# Patient Record
Sex: Female | Born: 1985 | Race: White | Hispanic: No | Marital: Married | Smoking: Never smoker
Health system: Southern US, Community
[De-identification: ages and names within clinical notes are randomized; demographics above are authoritative.]

## PROBLEM LIST (undated history)

## (undated) ENCOUNTER — Inpatient Hospital Stay (HOSPITAL_COMMUNITY): Payer: Self-pay

## (undated) DIAGNOSIS — F32A Depression, unspecified: Secondary | ICD-10-CM

## (undated) DIAGNOSIS — Z789 Other specified health status: Secondary | ICD-10-CM

## (undated) DIAGNOSIS — E119 Type 2 diabetes mellitus without complications: Secondary | ICD-10-CM

## (undated) DIAGNOSIS — Z8619 Personal history of other infectious and parasitic diseases: Secondary | ICD-10-CM

## (undated) DIAGNOSIS — T8859XA Other complications of anesthesia, initial encounter: Secondary | ICD-10-CM

## (undated) DIAGNOSIS — K589 Irritable bowel syndrome without diarrhea: Secondary | ICD-10-CM

## (undated) DIAGNOSIS — F329 Major depressive disorder, single episode, unspecified: Secondary | ICD-10-CM

## (undated) DIAGNOSIS — D279 Benign neoplasm of unspecified ovary: Secondary | ICD-10-CM

## (undated) DIAGNOSIS — R197 Diarrhea, unspecified: Secondary | ICD-10-CM

## (undated) HISTORY — PX: TONSILLECTOMY: SUR1361

## (undated) HISTORY — DX: Diarrhea, unspecified: R19.7

## (undated) HISTORY — DX: Irritable bowel syndrome, unspecified: K58.9

## (undated) HISTORY — DX: Personal history of other infectious and parasitic diseases: Z86.19

## (undated) HISTORY — PX: CHOLECYSTECTOMY: SHX55

---
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
## 2003-05-15 ENCOUNTER — Other Ambulatory Visit: Admission: RE | Admit: 2003-05-15 | Discharge: 2003-05-15 | Payer: Self-pay | Admitting: Obstetrics and Gynecology

## 2004-05-16 ENCOUNTER — Other Ambulatory Visit: Admission: RE | Admit: 2004-05-16 | Discharge: 2004-05-16 | Payer: Self-pay | Admitting: Obstetrics and Gynecology

## 2005-05-17 ENCOUNTER — Other Ambulatory Visit: Admission: RE | Admit: 2005-05-17 | Discharge: 2005-05-17 | Payer: Self-pay | Admitting: Obstetrics and Gynecology

## 2010-07-15 ENCOUNTER — Other Ambulatory Visit (HOSPITAL_COMMUNITY): Payer: Self-pay | Admitting: Obstetrics and Gynecology

## 2010-07-15 DIAGNOSIS — Z3682 Encounter for antenatal screening for nuchal translucency: Secondary | ICD-10-CM

## 2010-07-15 LAB — HIV ANTIBODY (ROUTINE TESTING W REFLEX): HIV: NONREACTIVE

## 2010-07-15 LAB — GC/CHLAMYDIA PROBE AMP, GENITAL: Gonorrhea: NEGATIVE

## 2010-07-15 LAB — RPR: RPR: NONREACTIVE

## 2010-07-15 LAB — HEPATITIS B SURFACE ANTIGEN: Hepatitis B Surface Ag: NEGATIVE

## 2010-07-15 LAB — ANTIBODY SCREEN: Antibody Screen: NEGATIVE

## 2010-08-24 ENCOUNTER — Other Ambulatory Visit (HOSPITAL_COMMUNITY): Payer: Self-pay | Admitting: Obstetrics and Gynecology

## 2010-08-24 ENCOUNTER — Encounter (HOSPITAL_COMMUNITY): Payer: Self-pay

## 2010-08-24 ENCOUNTER — Ambulatory Visit (HOSPITAL_COMMUNITY)
Admission: RE | Admit: 2010-08-24 | Discharge: 2010-08-24 | Disposition: A | Discharge status: Home / Self Care | Payer: 59 | Source: Ambulatory Visit | Attending: Obstetrics and Gynecology | Admitting: Obstetrics and Gynecology

## 2010-08-24 DIAGNOSIS — E669 Obesity, unspecified: Secondary | ICD-10-CM | POA: Insufficient documentation

## 2010-08-24 DIAGNOSIS — Z3682 Encounter for antenatal screening for nuchal translucency: Secondary | ICD-10-CM

## 2010-08-24 DIAGNOSIS — O99210 Obesity complicating pregnancy, unspecified trimester: Secondary | ICD-10-CM | POA: Insufficient documentation

## 2010-08-24 DIAGNOSIS — O3510X0 Maternal care for (suspected) chromosomal abnormality in fetus, unspecified, not applicable or unspecified: Secondary | ICD-10-CM | POA: Insufficient documentation

## 2010-08-24 DIAGNOSIS — Z3689 Encounter for other specified antenatal screening: Secondary | ICD-10-CM | POA: Insufficient documentation

## 2010-08-24 DIAGNOSIS — O351XX0 Maternal care for (suspected) chromosomal abnormality in fetus, not applicable or unspecified: Secondary | ICD-10-CM | POA: Insufficient documentation

## 2010-08-25 ENCOUNTER — Other Ambulatory Visit: Payer: Self-pay | Admitting: Maternal and Fetal Medicine

## 2010-09-23 NOTE — Progress Notes (Signed)
See OB ultrasound report in [facility] 

## 2010-10-05 ENCOUNTER — Other Ambulatory Visit (HOSPITAL_COMMUNITY): Payer: Self-pay | Admitting: Obstetrics and Gynecology

## 2010-10-05 ENCOUNTER — Ambulatory Visit (HOSPITAL_COMMUNITY)
Admission: RE | Admit: 2010-10-05 | Discharge: 2010-10-05 | Disposition: A | Discharge status: Home / Self Care | Payer: 59 | Source: Ambulatory Visit | Attending: Obstetrics and Gynecology | Admitting: Obstetrics and Gynecology

## 2010-10-05 VITALS — BP 133/79 | HR 95 | Wt 258.0 lb

## 2010-10-05 DIAGNOSIS — O358XX0 Maternal care for other (suspected) fetal abnormality and damage, not applicable or unspecified: Secondary | ICD-10-CM | POA: Insufficient documentation

## 2010-10-05 DIAGNOSIS — Z1389 Encounter for screening for other disorder: Secondary | ICD-10-CM | POA: Insufficient documentation

## 2010-10-05 DIAGNOSIS — Z3682 Encounter for antenatal screening for nuchal translucency: Secondary | ICD-10-CM

## 2010-10-05 DIAGNOSIS — E669 Obesity, unspecified: Secondary | ICD-10-CM | POA: Insufficient documentation

## 2010-10-05 DIAGNOSIS — O99210 Obesity complicating pregnancy, unspecified trimester: Secondary | ICD-10-CM | POA: Insufficient documentation

## 2010-10-05 DIAGNOSIS — Z363 Encounter for antenatal screening for malformations: Secondary | ICD-10-CM | POA: Insufficient documentation

## 2010-10-05 NOTE — Progress Notes (Deleted)
Ultrasound in [facility].  Follow up U/S scheduled 

## 2010-10-05 NOTE — Progress Notes (Signed)
Report in [facility]; follow-up as needed 

## 2011-02-07 NOTE — L&D Delivery Note (Signed)
Delivery Note At 12:38 AM a viable and healthy female was delivered via Vaginal, Spontaneous Delivery (Presentation:Left occiput ;anterior  ).  APGAR: 8, 9; weight 8 lb 7.3 oz (3835 g).   Placenta status: Intact, Spontaneous.  Cord: 3 vessels   Pt progressed quickly in labor and delivered a vigorous female infant in the vertex LOA presentation weighing 8#7.3 with apgars of 8 & 9.  Placenta delivered spontaneously, intact, with 3V cord.  A second degree laceration was repaired with 3-0 vicryl.  Mother and baby are doing well after delivery. EBL 300  Anesthesia: Epidural  Episiotomy: None Lacerations: 2nd degree Suture Repair: 3-0 vicryl Est. Blood Loss (mL): 300  Mom to postpartum.  Baby to nursery-stable.  Dianna Ewald H. 02/26/2011, 1:03 AM

## 2011-02-09 ENCOUNTER — Encounter (HOSPITAL_COMMUNITY): Payer: Self-pay

## 2011-02-09 ENCOUNTER — Inpatient Hospital Stay (HOSPITAL_COMMUNITY)
Admission: AD | Admit: 2011-02-09 | Discharge: 2011-02-09 | Disposition: A | Discharge status: Home / Self Care | Payer: 59 | Source: Ambulatory Visit | Attending: Obstetrics and Gynecology | Admitting: Obstetrics and Gynecology

## 2011-02-09 DIAGNOSIS — R030 Elevated blood-pressure reading, without diagnosis of hypertension: Secondary | ICD-10-CM | POA: Insufficient documentation

## 2011-02-09 DIAGNOSIS — O99891 Other specified diseases and conditions complicating pregnancy: Secondary | ICD-10-CM | POA: Insufficient documentation

## 2011-02-09 HISTORY — DX: Major depressive disorder, single episode, unspecified: F32.9

## 2011-02-09 HISTORY — DX: Depression, unspecified: F32.A

## 2011-02-09 LAB — CBC
Platelets: 225 10*3/uL (ref 150–400)
RBC: 4.05 MIL/uL (ref 3.87–5.11)
WBC: 12.5 10*3/uL — ABNORMAL HIGH (ref 4.0–10.5)

## 2011-02-09 LAB — COMPREHENSIVE METABOLIC PANEL
ALT: 16 U/L (ref 0–35)
AST: 25 U/L (ref 0–37)
Albumin: 2.4 g/dL — ABNORMAL LOW (ref 3.5–5.2)
CO2: 22 mEq/L (ref 19–32)
Calcium: 8.8 mg/dL (ref 8.4–10.5)
Chloride: 103 mEq/L (ref 96–112)
GFR calc non Af Amer: >90 mL/min (ref >90)
Sodium: 133 mEq/L — ABNORMAL LOW (ref 135–145)
Total Bilirubin: 0.4 mg/dL (ref 0.3–1.2)

## 2011-02-09 LAB — URINALYSIS, ROUTINE W REFLEX MICROSCOPIC
Bilirubin Urine: NEGATIVE
Glucose, UA: NEGATIVE mg/dL
Hgb urine dipstick: NEGATIVE
Specific Gravity, Urine: 1.015 (ref 1.005–1.030)
Urobilinogen, UA: 0.2 mg/dL (ref 0.0–1.0)

## 2011-02-09 LAB — URINE MICROSCOPIC-ADD ON

## 2011-02-09 MED ORDER — ZOLPIDEM TARTRATE 10 MG PO TABS
10.0000 mg | ORAL_TABLET | Freq: Every evening | ORAL | Status: DC | PRN
Start: 2011-02-09 — End: 2011-02-27

## 2011-02-09 NOTE — Progress Notes (Signed)
Patient states she was seen in the office today for a regular visit. Blood pressure was elevated and sent to MAU for evaluation. Denies any bleeding, leaking or pain. Reports good fetal movement.

## 2011-02-09 NOTE — Progress Notes (Signed)
CC: Elevated BP, feeling poorly S: 26 yo G1P0 at 36+3 sent over from the office for elevated BPs and trace proteinuria.  Pt slept poorly last night and today she didn't feel well.  In the office today her BPs were 150/80. Patient denies HA, vision changes, N/V or severe epigastric pain or RUQ pain. O:  Filed Vitals:   02/09/11 1115 02/09/11 1130 02/09/11 1145 02/09/11 1200  BP: 131/83 129/85 109/80 110/82  Pulse: 94 92 97 103  Temp:      TempSrc:      Resp:      Height:      Weight:      SpO2:       AOx3, NAD Gravid soft, NT/ND FHT 140-150 reactive with accels, no decels Cvx 1-2/50/-2 posterior toco irritable, Q3-5  CMP     Component Value Date/Time   NA 133* 02/09/2011 1054   K 4.0 02/09/2011 1054   CL 103 02/09/2011 1054   CO2 22 02/09/2011 1054   GLUCOSE 86 02/09/2011 1054   BUN 7 02/09/2011 1054   CREATININE 0.81 02/09/2011 1054   CALCIUM 8.8 02/09/2011 1054   PROT 6.1 02/09/2011 1054   ALBUMIN 2.4* 02/09/2011 1054   AST 25 02/09/2011 1054   ALT 16 02/09/2011 1054   ALKPHOS 146* 02/09/2011 1054   BILITOT 0.4 02/09/2011 1054   GFRNONAA >90 02/09/2011 1054   GFRAA >90 02/09/2011 1054    CBC    Component Value Date/Time   WBC 12.5* 02/09/2011 1054   RBC 4.05 02/09/2011 1054   HGB 11.9* 02/09/2011 1054   HCT 35.6* 02/09/2011 1054   PLT 225 02/09/2011 1054   MCV 87.9 02/09/2011 1054   MCH 29.4 02/09/2011 1054   MCHC 33.4 02/09/2011 1054   RDW 13.8 02/09/2011 1054    UA neg protein  A/P 1) reassuring FWB 2) Normotensive BP, labs WNL 3) Ambien for difficulty sleeping. 4) D/C home

## 2011-02-23 ENCOUNTER — Encounter (HOSPITAL_COMMUNITY): Payer: Self-pay | Admitting: *Deleted

## 2011-02-23 ENCOUNTER — Telehealth (HOSPITAL_COMMUNITY): Payer: Self-pay | Admitting: *Deleted

## 2011-02-23 NOTE — Telephone Encounter (Signed)
Preadmission screen  

## 2011-02-25 ENCOUNTER — Encounter (HOSPITAL_COMMUNITY): Payer: Self-pay | Admitting: Anesthesiology

## 2011-02-25 ENCOUNTER — Inpatient Hospital Stay (HOSPITAL_COMMUNITY): Payer: 59 | Admitting: Anesthesiology

## 2011-02-25 ENCOUNTER — Inpatient Hospital Stay (HOSPITAL_COMMUNITY)
Admission: AD | Admit: 2011-02-25 | Discharge: 2011-02-27 | DRG: 775 | Disposition: A | Discharge status: Home / Self Care | Payer: 59 | Source: Ambulatory Visit | Attending: Obstetrics and Gynecology | Admitting: Obstetrics and Gynecology

## 2011-02-25 ENCOUNTER — Encounter (HOSPITAL_COMMUNITY): Payer: Self-pay

## 2011-02-25 DIAGNOSIS — R030 Elevated blood-pressure reading, without diagnosis of hypertension: Secondary | ICD-10-CM | POA: Diagnosis present

## 2011-02-25 DIAGNOSIS — O99892 Other specified diseases and conditions complicating childbirth: Secondary | ICD-10-CM | POA: Diagnosis present

## 2011-02-25 HISTORY — DX: Benign neoplasm of unspecified ovary: D27.9

## 2011-02-25 LAB — COMPREHENSIVE METABOLIC PANEL
AST: 25 U/L (ref 0–37)
Alkaline Phosphatase: 157 U/L — ABNORMAL HIGH (ref 39–117)
CO2: 21 mEq/L (ref 19–32)
Chloride: 104 mEq/L (ref 96–112)
Creatinine, Ser: 0.79 mg/dL (ref 0.50–1.10)
GFR calc non Af Amer: >90 mL/min (ref >90)
Total Bilirubin: 0.2 mg/dL — ABNORMAL LOW (ref 0.3–1.2)

## 2011-02-25 LAB — URIC ACID: Uric Acid, Serum: 5.9 mg/dL (ref 2.4–7.0)

## 2011-02-25 LAB — CBC
HCT: 33.2 % — ABNORMAL LOW (ref 36.0–46.0)
MCH: 29.3 pg (ref 26.0–34.0)
MCV: 87.6 fL (ref 78.0–100.0)
Platelets: 250 10*3/uL (ref 150–400)
RBC: 3.79 MIL/uL — ABNORMAL LOW (ref 3.87–5.11)

## 2011-02-25 MED ORDER — OXYTOCIN 20 UNITS IN LACTATED RINGERS INFUSION - SIMPLE
1.0000 m[IU]/min | INTRAVENOUS | Status: DC
  Administered 2011-02-25: 2 m[IU]/min via INTRAVENOUS
  Filled 2011-02-25: qty 1000

## 2011-02-25 MED ORDER — OXYCODONE-ACETAMINOPHEN 5-325 MG PO TABS
2.0000 | ORAL_TABLET | ORAL | Status: DC | PRN
  Administered 2011-02-26: 2 via ORAL
  Filled 2011-02-25: qty 2

## 2011-02-25 MED ORDER — LIDOCAINE-EPINEPHRINE 2 %-1:100000 IJ SOLN
INTRAMUSCULAR | Status: DC | PRN
  Administered 2011-02-25: 10 mL

## 2011-02-25 MED ORDER — OXYTOCIN BOLUS FROM INFUSION: 500.0000 mL | Freq: Once | INTRAVENOUS | Status: DC

## 2011-02-25 MED ORDER — ONDANSETRON HCL 4 MG/2ML IJ SOLN: 4.0000 mg | Freq: Four times a day (QID) | INTRAMUSCULAR | Status: DC | PRN

## 2011-02-25 MED ORDER — LIDOCAINE HCL 1.5 % IJ SOLN
INTRAMUSCULAR | Status: DC | PRN
  Administered 2011-02-25 (×2): 5 mL via EPIDURAL

## 2011-02-25 MED ORDER — OXYTOCIN 20 UNITS IN LACTATED RINGERS INFUSION - SIMPLE: 125.0000 mL/h | Freq: Once | INTRAVENOUS | Status: DC

## 2011-02-25 MED ORDER — CEFAZOLIN SODIUM 1-5 GM-% IV SOLN: 1.0000 g | Freq: Three times a day (TID) | INTRAVENOUS | Status: DC

## 2011-02-25 MED ORDER — BUTORPHANOL TARTRATE 2 MG/ML IJ SOLN
1.0000 mg | INTRAMUSCULAR | Status: DC | PRN
  Administered 2011-02-25 (×2): 1 mg via INTRAVENOUS
  Filled 2011-02-25 (×2): qty 1

## 2011-02-25 MED ORDER — PENICILLIN G POTASSIUM 5000000 UNITS IJ SOLR: 2.5000 10*6.[IU] | INTRAVENOUS | Status: DC

## 2011-02-25 MED ORDER — IBUPROFEN 600 MG PO TABS: 600.0000 mg | ORAL_TABLET | Freq: Four times a day (QID) | ORAL | Status: DC | PRN

## 2011-02-25 MED ORDER — LACTATED RINGERS IV SOLN
INTRAVENOUS | Status: DC
  Administered 2011-02-25: 22:00:00 via INTRAVENOUS
  Administered 2011-02-25: 125 mL/h via INTRAVENOUS

## 2011-02-25 MED ORDER — NALBUPHINE SYRINGE 5 MG/0.5 ML: 10.0000 mg | INJECTION | INTRAMUSCULAR | Status: DC | PRN

## 2011-02-25 MED ORDER — LACTATED RINGERS IV SOLN
500.0000 mL | Freq: Once | INTRAVENOUS | Status: AC
  Administered 2011-02-25: 500 mL via INTRAVENOUS

## 2011-02-25 MED ORDER — ACETAMINOPHEN 325 MG PO TABS: 650.0000 mg | ORAL_TABLET | ORAL | Status: DC | PRN

## 2011-02-25 MED ORDER — FENTANYL 2.5 MCG/ML BUPIVACAINE 1/10 % EPIDURAL INFUSION (WH - ANES)
14.0000 mL/h | INTRAMUSCULAR | Status: DC
  Administered 2011-02-25: 14 mL/h via EPIDURAL
  Filled 2011-02-25 (×2): qty 60

## 2011-02-25 MED ORDER — DIPHENHYDRAMINE HCL 50 MG/ML IJ SOLN: 12.5000 mg | INTRAMUSCULAR | Status: DC | PRN

## 2011-02-25 MED ORDER — EPHEDRINE 5 MG/ML INJ
10.0000 mg | INTRAVENOUS | Status: DC | PRN
  Filled 2011-02-25: qty 4

## 2011-02-25 MED ORDER — TERBUTALINE SULFATE 1 MG/ML IJ SOLN: 0.2500 mg | Freq: Once | INTRAMUSCULAR | Status: AC | PRN

## 2011-02-25 MED ORDER — PHENYLEPHRINE 40 MCG/ML (10ML) SYRINGE FOR IV PUSH (FOR BLOOD PRESSURE SUPPORT)
80.0000 ug | PREFILLED_SYRINGE | INTRAVENOUS | Status: DC | PRN
  Filled 2011-02-25: qty 5

## 2011-02-25 MED ORDER — OXYTOCIN BOLUS FROM INFUSION
500.0000 mL | Freq: Once | INTRAVENOUS | Status: DC
  Filled 2011-02-25: qty 500

## 2011-02-25 MED ORDER — LACTATED RINGERS IV SOLN: 500.0000 mL | INTRAVENOUS | Status: DC | PRN

## 2011-02-25 MED ORDER — CITRIC ACID-SODIUM CITRATE 334-500 MG/5ML PO SOLN: 30.0000 mL | ORAL | Status: DC | PRN

## 2011-02-25 MED ORDER — EPHEDRINE 5 MG/ML INJ
10.0000 mg | INTRAVENOUS | Status: DC | PRN
Start: 2011-02-25 — End: 2011-02-26

## 2011-02-25 MED ORDER — PENICILLIN G POTASSIUM 5000000 UNITS IJ SOLR: 5.0000 10*6.[IU] | Freq: Once | INTRAVENOUS | Status: DC

## 2011-02-25 MED ORDER — LIDOCAINE HCL (PF) 1 % IJ SOLN
30.0000 mL | INTRAMUSCULAR | Status: DC | PRN
  Administered 2011-02-26: 30 mL via SUBCUTANEOUS
  Filled 2011-02-25: qty 30

## 2011-02-25 MED ORDER — OXYCODONE-ACETAMINOPHEN 5-325 MG PO TABS: 2.0000 | ORAL_TABLET | ORAL | Status: DC | PRN

## 2011-02-25 MED ORDER — OXYTOCIN 20 UNITS IN LACTATED RINGERS INFUSION - SIMPLE
125.0000 mL/h | Freq: Once | INTRAVENOUS | Status: AC
  Administered 2011-02-26: 125 mL/h via INTRAVENOUS

## 2011-02-25 MED ORDER — PHENYLEPHRINE 40 MCG/ML (10ML) SYRINGE FOR IV PUSH (FOR BLOOD PRESSURE SUPPORT): 80.0000 ug | PREFILLED_SYRINGE | INTRAVENOUS | Status: DC | PRN

## 2011-02-25 MED ORDER — IBUPROFEN 600 MG PO TABS
600.0000 mg | ORAL_TABLET | Freq: Four times a day (QID) | ORAL | Status: DC | PRN
  Administered 2011-02-26: 600 mg via ORAL
  Filled 2011-02-25: qty 1

## 2011-02-25 MED ORDER — CEFAZOLIN SODIUM-DEXTROSE 2-3 GM-% IV SOLR: 2.0000 g | Freq: Once | INTRAVENOUS | Status: DC

## 2011-02-25 MED ORDER — FENTANYL 2.5 MCG/ML BUPIVACAINE 1/10 % EPIDURAL INFUSION (WH - ANES)
INTRAMUSCULAR | Status: DC | PRN
  Administered 2011-02-25: 14 mL/h via EPIDURAL

## 2011-02-25 MED ORDER — LACTATED RINGERS IV SOLN: INTRAVENOUS | Status: DC

## 2011-02-25 MED ORDER — BUTORPHANOL TARTRATE 2 MG/ML IJ SOLN: 1.0000 mg | INTRAMUSCULAR | Status: DC | PRN

## 2011-02-25 MED ORDER — ZOLPIDEM TARTRATE 10 MG PO TABS: 10.0000 mg | ORAL_TABLET | Freq: Every evening | ORAL | Status: DC | PRN

## 2011-02-25 NOTE — Progress Notes (Signed)
Pt reports having fluid leaking out about 1 hr ago. Blood tinged fluid note. Having mild-moderate contractions 7-10 min apart. Reports good fetal movement up until her water broke.

## 2011-02-25 NOTE — Anesthesia Preprocedure Evaluation (Signed)
Anesthesia Evaluation  Patient identified by MRN, date of birth, ID band Patient awake    Reviewed: Allergy & Precautions, H&P , NPO status , Patient's Chart, lab work & pertinent test results  Airway Mallampati: II TM Distance: >3 FB Neck ROM: full    Dental No notable dental hx.    Pulmonary neg pulmonary ROS,    Pulmonary exam normal       Cardiovascular neg cardio ROS     Neuro/Psych PSYCHIATRIC DISORDERS Depression Negative Neurological ROS     GI/Hepatic negative GI ROS, Neg liver ROS,   Endo/Other  Morbid obesity  Renal/GU negative Renal ROS  Genitourinary negative   Musculoskeletal negative musculoskeletal ROS (+)   Abdominal (+) obese,   Peds negative pediatric ROS (+)  Hematology negative hematology ROS (+)   Anesthesia Other Findings   Reproductive/Obstetrics (+) Pregnancy                           Anesthesia Physical Anesthesia Plan  ASA: III  Anesthesia Plan: Epidural   Post-op Pain Management:    Induction:   Airway Management Planned:   Additional Equipment:   Intra-op Plan:   Post-operative Plan:   Informed Consent: I have reviewed the patients History and Physical, chart, labs and discussed the procedure including the risks, benefits and alternatives for the proposed anesthesia with the patient or authorized representative who has indicated his/her understanding and acceptance.     Plan Discussed with:   Anesthesia Plan Comments:         Anesthesia Quick Evaluation  

## 2011-02-25 NOTE — Anesthesia Procedure Notes (Signed)
Epidural Patient location during procedure: OB Start time: 02/25/2011 10:05 PM End time: 02/25/2011 10:12 PM Reason for block: procedure for pain  Staffing Anesthesiologist: Sandrea Hughs Performed by: anesthesiologist   Preanesthetic Checklist Completed: patient identified, site marked, surgical consent, pre-op evaluation, timeout performed, IV checked, risks and benefits discussed and monitors and equipment checked  Epidural Patient position: sitting Prep: site prepped and draped and DuraPrep Patient monitoring: continuous pulse ox and blood pressure Approach: midline Injection technique: LOR air  Needle:  Needle type: Tuohy  Needle gauge: 17 G Needle length: 9 cm Needle insertion depth: 7 cm Catheter type: closed end flexible Catheter size: 19 Gauge Catheter at skin depth: 12 cm Test dose: negative and 1.5% lidocaine  Assessment Events: blood not aspirated, injection not painful, no injection resistance, negative IV test and no paresthesia

## 2011-02-25 NOTE — Progress Notes (Signed)
Patient ID: Sandy Mata, female   DOB: Jun 26, 1985, 26 y.o.   MRN: [id number]  S: Pt comfortable, having minimal bleeding currently  O: Filed Vitals:   02/25/11 1823 02/25/11 1835 02/25/11 1859  BP: 142/82  159/86  Pulse: 77  72  Temp: 98 F (36.7 C)    TempSrc: Oral    Resp: 20  18  Height:  5\' 8"  (1.727 m)   Weight:  127.914 kg (282 lb)    Cvx 3/80/-2  FHT: 140-150 reactive  toco irregular  AROM attempted x 3, only mucoid blood detected. No return of fluid.  Pt did report gush of bloody fluid at home prior to arrival. May have SROMd  A/P 1) Augement labor with pitocin 2) FWB reassuring

## 2011-02-25 NOTE — H&P (Signed)
Sandy Mata is a 26 y.o. female presenting for bleeding 1-2 hours prior to admission the patient noticed a large gush of fluid and when she checked she was bleeding.  She denies contractions or abdominal pain.  She has had active fetal movement throughout the day.  Since 35-36 weeks she has had some elevated BPs. Pre-eclampsia labs and a 24 hour urine were normal. Given these findings the decision was made to admit the patient for induction of labor. 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  History OB History    Grav Para Term Preterm Abortions TAB SAB Ect Mult Living   1 0 0 0 0 0 0 0 0 0      Past Medical History  Diagnosis Date  . Depression     quit meds with pregnancy  . Ulcer   . History of chlamydia   . IBS (irritable bowel syndrome)   . Dermoid cyst of ovary    Past Surgical History  Procedure Date  . Cholecystectomy   . Tonsillectomy    Family History: family history includes Crohn's disease in her brother; Diabetes in her father and paternal grandmother; Heart disease in her father; and Hypertension in her father. Social History:  reports that she has never smoked. She has never used smokeless tobacco. She reports that she does not drink alcohol or use illicit drugs.  ROS: as above  Dilation: 3 Effacement (%): 70 Station: -1 Exam by:: Sandy Mata CNM There were no vitals taken for this visit. Exam Physical Exam  Prenatal labs: ABO, Rh: O/Positive/-- (06/08 0000) Antibody: Negative (06/08 0000) Rubella: Immune (06/08 0000) RPR: Nonreactive (06/08 0000)  HBsAg: Negative (06/08 0000)  HIV: Non-reactive (06/08 0000)  GBS: Negative (12/27 0000)   Assessment/Plan: 1) Admit 2) AROM 3) Pitocin if necessary for augmentation of labor 4) Monitor bleeding, if worsens or NRFWB develops then proceed with cesarean section.  Will consent  Sandy Mata H. 02/25/2011, 6:13 PM

## 2011-02-25 NOTE — ED Provider Notes (Signed)
History     Chief Complaint  Patient presents with  . Rupture of Membranes   HPI 26 y.o. G1P0000 at [redacted]w[redacted]d c/o leaking bloody fluid since 1600 today, no pain, + fetal movement. Normal prenatal course.    Past Medical History  Diagnosis Date  . Depression     quit meds with pregnancy  . Ulcer   . History of chlamydia   . IBS (irritable bowel syndrome)     Past Surgical History  Procedure Date  . Cholecystectomy   . Tonsillectomy     Family History  Problem Relation Age of Onset  . Heart disease Father   . Hypertension Father   . Diabetes Father   . Crohn's disease Brother   . Diabetes Paternal Grandmother     History  Substance Use Topics  . Smoking status: Never Smoker   . Smokeless tobacco: Never Used  . Alcohol Use: No    Allergies: No Known Allergies  Prescriptions prior to admission  Medication Sig Dispense Refill  . Ondansetron HCl (ZOFRAN PO) Take 1 tablet by mouth daily as needed. Patient is using this medication for nausea.      . Prenatal Vitamins (DIS) TABS Take by mouth.        . zolpidem (AMBIEN) 10 MG tablet Take 1 tablet (10 mg total) by mouth at bedtime as needed for sleep.  30 tablet  0    Review of Systems  Constitutional: Negative.   Respiratory: Negative.   Cardiovascular: Negative.   Gastrointestinal: Negative for nausea, vomiting, abdominal pain, diarrhea and constipation.  Genitourinary: Negative for dysuria, urgency, frequency, hematuria and flank pain.       Positive for vaginal bleeding, negative for contractions   Musculoskeletal: Negative.   Neurological: Negative.   Psychiatric/Behavioral: Negative.    Physical Exam   There were no vitals taken for this visit.  Physical Exam  Nursing note and vitals reviewed. Constitutional: She is oriented to person, place, and time. She appears well-developed and well-nourished. No distress.  HENT:  Head: Normocephalic and atraumatic.  Cardiovascular: Normal rate.   Respiratory:  Effort normal.  GI: Soft. Bowel sounds are normal. She exhibits no mass. There is no tenderness. There is no rebound and no guarding.  Genitourinary: There is no rash or lesion on the right labia. There is no rash or lesion on the left labia. Uterus is not tender. Enlarged: Size c/w dates. Cervix exhibits no discharge. There is bleeding (moderate) around the vagina. No tenderness around the vagina. No vaginal discharge found.  Musculoskeletal: Normal range of motion.  Neurological: She is alert and oriented to person, place, and time.  Skin: Skin is warm and dry.  Psychiatric: She has a normal mood and affect.   Fern negative  EFM: 150, mod variability, + accels, no decels  MAU Course  Procedures    Assessment and Plan  26 y.o. G1P0000 at [redacted]w[redacted]d Bleeding Pt to be admitted to L&D per Dr. Sallee Lange 02/25/2011, 5:04 PM

## 2011-02-26 ENCOUNTER — Encounter (HOSPITAL_COMMUNITY): Payer: Self-pay | Admitting: *Deleted

## 2011-02-26 MED ORDER — DIPHENHYDRAMINE HCL 25 MG PO CAPS: 25.0000 mg | ORAL_CAPSULE | Freq: Four times a day (QID) | ORAL | Status: DC | PRN

## 2011-02-26 MED ORDER — SENNOSIDES-DOCUSATE SODIUM 8.6-50 MG PO TABS
2.0000 | ORAL_TABLET | Freq: Every day | ORAL | Status: DC
  Administered 2011-02-26: 2 via ORAL

## 2011-02-26 MED ORDER — LANOLIN HYDROUS EX OINT: TOPICAL_OINTMENT | CUTANEOUS | Status: DC | PRN

## 2011-02-26 MED ORDER — TETANUS-DIPHTH-ACELL PERTUSSIS 5-2.5-18.5 LF-MCG/0.5 IM SUSP: 0.5000 mL | Freq: Once | INTRAMUSCULAR | Status: DC

## 2011-02-26 MED ORDER — PRENATAL MULTIVITAMIN CH
1.0000 | ORAL_TABLET | Freq: Every day | ORAL | Status: DC
  Administered 2011-02-26 – 2011-02-27 (×2): 1 via ORAL
  Filled 2011-02-26 (×2): qty 1

## 2011-02-26 MED ORDER — METHYLERGONOVINE MALEATE 0.2 MG/ML IJ SOLN: 0.2000 mg | INTRAMUSCULAR | Status: DC | PRN

## 2011-02-26 MED ORDER — DIBUCAINE 1 % RE OINT: 1.0000 "application " | TOPICAL_OINTMENT | RECTAL | Status: DC | PRN

## 2011-02-26 MED ORDER — BENZOCAINE-MENTHOL 20-0.5 % EX AERO
1.0000 "application " | INHALATION_SPRAY | CUTANEOUS | Status: DC | PRN
  Administered 2011-02-27: 1 via TOPICAL

## 2011-02-26 MED ORDER — ONDANSETRON HCL 4 MG/2ML IJ SOLN: 4.0000 mg | INTRAMUSCULAR | Status: DC | PRN

## 2011-02-26 MED ORDER — ONDANSETRON HCL 4 MG PO TABS
4.0000 mg | ORAL_TABLET | ORAL | Status: DC | PRN
  Administered 2011-02-27: 4 mg via ORAL
  Filled 2011-02-26: qty 1

## 2011-02-26 MED ORDER — BENZOCAINE-MENTHOL 20-0.5 % EX AERO
INHALATION_SPRAY | CUTANEOUS | Status: AC
  Administered 2011-02-27: 1 via TOPICAL
  Filled 2011-02-26: qty 56

## 2011-02-26 MED ORDER — ZOLPIDEM TARTRATE 5 MG PO TABS
5.0000 mg | ORAL_TABLET | Freq: Every evening | ORAL | Status: DC | PRN
Start: 2011-02-26 — End: 2011-02-27

## 2011-02-26 MED ORDER — OXYCODONE-ACETAMINOPHEN 5-325 MG PO TABS
1.0000 | ORAL_TABLET | ORAL | Status: DC | PRN
  Administered 2011-02-26 – 2011-02-27 (×5): 1 via ORAL
  Filled 2011-02-26 (×5): qty 1

## 2011-02-26 MED ORDER — METHYLERGONOVINE MALEATE 0.2 MG PO TABS: 0.2000 mg | ORAL_TABLET | ORAL | Status: DC | PRN

## 2011-02-26 MED ORDER — SIMETHICONE 80 MG PO CHEW: 80.0000 mg | CHEWABLE_TABLET | ORAL | Status: DC | PRN

## 2011-02-26 MED ORDER — WITCH HAZEL-GLYCERIN EX PADS: 1.0000 "application " | MEDICATED_PAD | CUTANEOUS | Status: DC | PRN

## 2011-02-26 MED ORDER — IBUPROFEN 600 MG PO TABS
600.0000 mg | ORAL_TABLET | Freq: Four times a day (QID) | ORAL | Status: DC
  Administered 2011-02-26 – 2011-02-27 (×5): 600 mg via ORAL
  Filled 2011-02-26 (×6): qty 1

## 2011-02-26 NOTE — Anesthesia Postprocedure Evaluation (Signed)
Anesthesia Post Note  Patient: Sandy Mata  Procedure(s) Performed: * No procedures listed *  Anesthesia type: Epidural  Patient location: Mother/Baby  Post pain: Pain level controlled  Post assessment: Post-op Vital signs reviewed  Last Vitals:  Filed Vitals:   02/26/11 0321  BP: 142/82  Pulse: 96  Temp:   Resp: 18    Post vital signs: Reviewed  Level of consciousness: awake  Complications: No apparent anesthesia complications

## 2011-02-26 NOTE — Anesthesia Postprocedure Evaluation (Signed)
  Anesthesia Post-op Note  Patient: Sandy Mata  Procedure(s) Performed: * No procedures listed *  Patient Location: Mother/Baby  Anesthesia Type: Epidural  Level of Consciousness: awake, alert  and oriented  Airway and Oxygen Therapy: Patient Spontanous Breathing  Post-op Pain: mild  Post-op Assessment: Patient's Cardiovascular Status Stable, Respiratory Function Stable, Patent Airway, No signs of Nausea or vomiting and Pain level controlled  Post-op Vital Signs: stable  Complications: No apparent anesthesia complications

## 2011-02-26 NOTE — Progress Notes (Signed)

## 2011-02-26 NOTE — Addendum Note (Signed)
Addendum  created 02/26/11 1002 by Lincoln Brigham, CRNA   Modules edited:Charges VN

## 2011-02-26 NOTE — Addendum Note (Signed)
Addendum  created 02/26/11 1000 by Lincoln Brigham, CRNA   Modules edited:Charges VN, Notes Section

## 2011-02-26 NOTE — Progress Notes (Signed)
Post Partum Day 0 Subjective: no complaints, up ad lib, voiding and tolerating PO  Objective: Blood pressure 128/79, pulse 91, temperature 98.6 F (37 C), temperature source Oral, resp. rate 18, height 5\' 8"  (1.727 m), weight 127.914 kg (282 lb), SpO2 98.00%, unknown if currently breastfeeding.  Physical Exam:  General: alert, cooperative and appears stated age Lochia: appropriate Uterine Fundus: firm DVT Evaluation: No evidence of DVT seen on physical exam.   Basename 02/25/11 1805  HGB 11.1*  HCT 33.2*    Assessment/Plan: Routine Postpartum   LOS: 1 day   Sandy Mata H. 02/26/2011, 10:06 AM

## 2011-02-27 LAB — RPR: RPR Ser Ql: NONREACTIVE

## 2011-02-27 LAB — CBC
Hemoglobin: 9 g/dL — ABNORMAL LOW (ref 12.0–15.0)
MCH: 29 pg (ref 26.0–34.0)
MCHC: 32 g/dL (ref 30.0–36.0)
MCV: 90.6 fL (ref 78.0–100.0)
RBC: 3.1 MIL/uL — ABNORMAL LOW (ref 3.87–5.11)

## 2011-02-27 MED ORDER — BENZOCAINE-MENTHOL 20-0.5 % EX AERO
INHALATION_SPRAY | CUTANEOUS | Status: AC
  Filled 2011-02-27: qty 56

## 2011-02-27 MED ORDER — IBUPROFEN 600 MG PO TABS: 600.0000 mg | ORAL_TABLET | Freq: Four times a day (QID) | ORAL | Status: AC

## 2011-02-27 NOTE — Discharge Summary (Signed)
Obstetric Discharge Summary Reason for Admission: Elevated blood pressures, vaginal bleeding Prenatal Procedures: none Intrapartum Procedures: spontaneous vaginal delivery Postpartum Procedures: none Complications-Operative and Postpartum: vaginal laceration Hemoglobin  Date Value Range Status  02/27/2011 9.0* 12.0-15.0 (g/dL) Final     DELTA CHECK NOTED     REPEATED TO VERIFY     HCT  Date Value Range Status  02/27/2011 28.1* 36.0-46.0 (%) Final    Discharge Diagnoses: Term Pregnancy-delivered  Discharge Information: Date: 02/27/2011 Activity: unrestricted Diet: routine Medications: PNV, Ibuprofen, Colace and Iron Condition: stable Instructions: refer to practice specific booklet Discharge to: home Follow-up Information    Follow up with Almon Hercules., MD in 4 weeks.   Contact information:   [address] [place] [address] [phone]          Newborn Data: Live born female  Birth Weight: 8 lb 7.3 oz (3835 g) APGAR: 8, 9  Home with mother.  Philip Aspen 02/27/2011, 8:45 AM

## 2011-03-02 ENCOUNTER — Inpatient Hospital Stay (HOSPITAL_COMMUNITY): Admission: RE | Admit: 2011-03-02 | Payer: 59 | Source: Ambulatory Visit

## 2013-02-06 NOTE — L&D Delivery Note (Signed)
Pt presents to L&D with premature labor. She progressed along a normal labor curve. She pushed for 68  Min and had a SVD of one live viable white female infant over a 2nd degree midline tear in the ROA position. Placenta-S/I. EBL-400cc. Tear closed with 3-0 chromic. Baby to NBN.

## 2013-06-16 LAB — OB RESULTS CONSOLE RPR: RPR: NONREACTIVE

## 2013-06-16 LAB — OB RESULTS CONSOLE ABO/RH: RH Type: POSITIVE

## 2013-06-16 LAB — OB RESULTS CONSOLE RUBELLA ANTIBODY, IGM: Rubella: IMMUNE

## 2013-06-16 LAB — OB RESULTS CONSOLE HIV ANTIBODY (ROUTINE TESTING): HIV: NONREACTIVE

## 2013-06-16 LAB — OB RESULTS CONSOLE HEPATITIS B SURFACE ANTIGEN: HEP B S AG: NEGATIVE

## 2013-06-16 LAB — OB RESULTS CONSOLE ANTIBODY SCREEN: ANTIBODY SCREEN: NEGATIVE

## 2013-07-28 ENCOUNTER — Other Ambulatory Visit (HOSPITAL_COMMUNITY): Payer: Self-pay | Admitting: Obstetrics & Gynecology

## 2013-07-28 DIAGNOSIS — Z1389 Encounter for screening for other disorder: Secondary | ICD-10-CM

## 2013-08-13 ENCOUNTER — Ambulatory Visit (HOSPITAL_COMMUNITY)
Admission: RE | Admit: 2013-08-13 | Discharge: 2013-08-13 | Disposition: A | Discharge status: Home / Self Care | Payer: 59 | Source: Ambulatory Visit | Attending: Obstetrics & Gynecology | Admitting: Obstetrics & Gynecology

## 2013-08-13 ENCOUNTER — Other Ambulatory Visit (HOSPITAL_COMMUNITY): Payer: Self-pay | Admitting: Obstetrics & Gynecology

## 2013-08-13 ENCOUNTER — Encounter (HOSPITAL_COMMUNITY): Payer: Self-pay

## 2013-08-13 DIAGNOSIS — Z1389 Encounter for screening for other disorder: Secondary | ICD-10-CM

## 2013-08-13 DIAGNOSIS — Z363 Encounter for antenatal screening for malformations: Secondary | ICD-10-CM | POA: Insufficient documentation

## 2013-12-04 ENCOUNTER — Encounter (HOSPITAL_COMMUNITY): Payer: Self-pay

## 2013-12-04 ENCOUNTER — Inpatient Hospital Stay (HOSPITAL_COMMUNITY)
Admission: AD | Admit: 2013-12-04 | Discharge: 2013-12-04 | Disposition: A | Discharge status: Home / Self Care | Payer: 59 | Source: Ambulatory Visit | Attending: Obstetrics and Gynecology | Admitting: Obstetrics and Gynecology

## 2013-12-04 DIAGNOSIS — O479 False labor, unspecified: Secondary | ICD-10-CM

## 2013-12-04 DIAGNOSIS — O4703 False labor before 37 completed weeks of gestation, third trimester: Secondary | ICD-10-CM

## 2013-12-04 DIAGNOSIS — Z3A35 35 weeks gestation of pregnancy: Secondary | ICD-10-CM | POA: Insufficient documentation

## 2013-12-04 LAB — URINALYSIS, ROUTINE W REFLEX MICROSCOPIC
Bilirubin Urine: NEGATIVE
Glucose, UA: NEGATIVE mg/dL
Hgb urine dipstick: NEGATIVE
Ketones, ur: NEGATIVE mg/dL
Nitrite: NEGATIVE
PROTEIN: NEGATIVE mg/dL
SPECIFIC GRAVITY, URINE: 1.02 (ref 1.005–1.030)
UROBILINOGEN UA: 0.2 mg/dL (ref 0.0–1.0)
pH: 7 (ref 5.0–8.0)

## 2013-12-04 LAB — URINE MICROSCOPIC-ADD ON

## 2013-12-04 NOTE — MAU Provider Note (Signed)
History     CSN: [id number]  Arrival date and time: 12/04/13 4098   First Provider Initiated Contact with Patient 12/04/13 [phone]      Chief Complaint  Patient presents with  . Contractions   HPI Sandy Mata 28 y.o. [id number] @[redacted]w[redacted]d  presents at [facility] complaining of back pain and abdominal pain that started at 9pm last evening.  She noes it comes and goes similar to a contraction.  It is worse with lying down.  The pains are increasingly intense.  She was able to sleep about 3 hours and has been up entirely since 2 pm.  She did not take any medicines.  She denies nausea, vomiting, diarrhea, constipation, fever, weakness, headache, vaginal bleeding, dysuria.  She endorses good fetal movement.   OB History   Grav Para Term Preterm Abortions TAB SAB Ect Mult Living   2 1 1  0 0 0 0 0 0 1      Past Medical History  Diagnosis Date  . Depression     quit meds with pregnancy  . History of chlamydia   . IBS (irritable bowel syndrome)   . Dermoid cyst of ovary     Past Surgical History  Procedure Laterality Date  . Cholecystectomy    . Tonsillectomy      Family History  Problem Relation Age of Onset  . Heart disease Father   . Hypertension Father   . Diabetes Father   . Crohn's disease Brother   . Diabetes Paternal Grandmother     History  Substance Use Topics  . Smoking status: Never Smoker   . Smokeless tobacco: Never Used  . Alcohol Use: No    Allergies: No Known Allergies  Prescriptions prior to admission  Medication Sig Dispense Refill  . Prenatal Vit-Fe Fumarate-FA (PRENATAL MULTIVITAMIN) TABS tablet Take 1 tablet by mouth daily at 12 noon.        ROS Pertinent ROS in HPI Physical Exam   Blood pressure 127/83, pulse 90, resp. rate 18, height 5\' 8"  (1.727 m), weight 292 lb (132.45 kg), unknown if currently breastfeeding.  Physical Exam  Constitutional: She is oriented to person, place, and time. She appears well-developed and well-nourished.  HENT:  Head:  Normocephalic and atraumatic.  Eyes: EOM are normal.  Neck: Normal range of motion.  Cardiovascular: Normal rate and regular rhythm.   Respiratory: Effort normal and breath sounds normal. No respiratory distress.  GI: Soft. Bowel sounds are normal. She exhibits no distension.  Genitourinary: Vagina normal.  Musculoskeletal: Normal range of motion.  Neurological: She is alert and oriented to person, place, and time.  Skin: Skin is warm and dry.  Psychiatric: She has a normal mood and affect.   Results for orders placed during the hospital encounter of 12/04/13 (from the past 24 hour(s))  URINALYSIS, ROUTINE W REFLEX MICROSCOPIC     Status: Abnormal   Collection Time    12/04/13  7:55 AM      Result Value Ref Range   Color, Urine YELLOW  YELLOW   APPearance CLEAR  CLEAR   Specific Gravity, Urine 1.020  1.005 - 1.030   pH 7.0  5.0 - 8.0   Glucose, UA NEGATIVE  NEGATIVE mg/dL   Hgb urine dipstick NEGATIVE  NEGATIVE   Bilirubin Urine NEGATIVE  NEGATIVE   Ketones, ur NEGATIVE  NEGATIVE mg/dL   Protein, ur NEGATIVE  NEGATIVE mg/dL   Urobilinogen, UA 0.2  0.0 - 1.0 mg/dL   Nitrite NEGATIVE  NEGATIVE  Leukocytes, UA SMALL (*) NEGATIVE  URINE MICROSCOPIC-ADD ON     Status: None   Collection Time    12/04/13  7:55 AM      Result Value Ref Range   Squamous Epithelial / LPF RARE  RARE   WBC, UA 3-6  <3 WBC/hpf   Bacteria, UA RARE  RARE    Fetal Tracing:  Baseline:130s Variability:moderate (was less so in beginning but improved) Accelerations: 15x15 Decelerations:none  Toco:irregular   MAU Course  Procedures  MDM Discussed with Dr. Ouida Sills.  No regular, strong contractions, no LOF/vaginal bleeding and no change in cervix.  Okay to send home.  Assessment and Plan  A:  1. Braxton Hick's contraction    P: Discharge to home Labor Precautions given See OB MD asap for further discussion Return to MAU for emergency  Paticia Stack 12/04/2013, 8:25 AM

## 2013-12-04 NOTE — Discharge Instructions (Signed)
Braxton Hicks Contractions °Contractions of the uterus can occur throughout pregnancy. Contractions are not always a sign that you are in labor.  °WHAT ARE BRAXTON HICKS CONTRACTIONS?  °Contractions that occur before labor are called Braxton Hicks contractions, or false labor. Toward the end of pregnancy (32-34 weeks), these contractions can develop more often and may become more forceful. This is not true labor because these contractions do not result in opening (dilatation) and thinning of the cervix. They are sometimes difficult to tell apart from true labor because these contractions can be forceful and people have different pain tolerances. You should not feel embarrassed if you go to the hospital with false labor. Sometimes, the only way to tell if you are in true labor is for your health care provider to look for changes in the cervix. °If there are no prenatal problems or other health problems associated with the pregnancy, it is completely safe to be sent home with false labor and await the onset of true labor. °HOW CAN YOU TELL THE DIFFERENCE BETWEEN TRUE AND FALSE LABOR? °False Labor °· The contractions of false labor are usually shorter and not as hard as those of true labor.   °· The contractions are usually irregular.   °· The contractions are often felt in the front of the lower abdomen and in the groin.   °· The contractions may go away when you walk around or change positions while lying down.   °· The contractions get weaker and are shorter lasting as time goes on.   °· The contractions do not usually become progressively stronger, regular, and closer together as with true labor.   °True Labor °· Contractions in true labor last 30-70 seconds, become very regular, usually become more intense, and increase in frequency.   °· The contractions do not go away with walking.   °· The discomfort is usually felt in the top of the uterus and spreads to the lower abdomen and low back.   °· True labor can be  determined by your health care provider with an exam. This will show that the cervix is dilating and getting thinner.   °WHAT TO REMEMBER °· Keep up with your usual exercises and follow other instructions given by your health care provider.   °· Take medicines as directed by your health care provider.   °· Keep your regular prenatal appointments.   °· Eat and drink lightly if you think you are going into labor.   °· If Braxton Hicks contractions are making you uncomfortable:   °¨ Change your position from lying down or resting to walking, or from walking to resting.   °¨ Sit and rest in a tub of warm water.   °¨ Drink 2-3 glasses of water. Dehydration may cause these contractions.   °¨ Do slow and deep breathing several times an hour.   °WHEN SHOULD I SEEK IMMEDIATE MEDICAL CARE? °Seek immediate medical care if: °· Your contractions become stronger, more regular, and closer together.   °· You have fluid leaking or gushing from your vagina.   °· You have a fever.   °· You pass blood-tinged mucus.   °· You have vaginal bleeding.   °· You have continuous abdominal pain.   °· You have low back pain that you never had before.   °· You feel your baby's head pushing down and causing pelvic pressure.   °· Your baby is not moving as much as it used to.   °Document Released: 01/23/2005 Document Revised: 01/28/2013 Document Reviewed: 11/04/2012 °[employer]® Patient Information ©2015 [employer]. This information is not intended to replace advice given to you by your health care   provider. Make sure you discuss any questions you have with your health care provider. ° °

## 2013-12-04 NOTE — MAU Note (Signed)
Lower abdominal & back cramping since 9 pm last night. Denies LOF or vaginal bleeding. Positive fetal movement.

## 2013-12-08 ENCOUNTER — Encounter (HOSPITAL_COMMUNITY): Payer: Self-pay

## 2013-12-09 ENCOUNTER — Encounter (HOSPITAL_COMMUNITY): Payer: Self-pay | Admitting: *Deleted

## 2013-12-09 ENCOUNTER — Inpatient Hospital Stay (HOSPITAL_COMMUNITY)
Admission: AD | Admit: 2013-12-09 | Discharge: 2013-12-09 | Disposition: A | Discharge status: Home / Self Care | Payer: 59 | Source: Ambulatory Visit | Attending: Obstetrics and Gynecology | Admitting: Obstetrics and Gynecology

## 2013-12-09 DIAGNOSIS — Z3A35 35 weeks gestation of pregnancy: Secondary | ICD-10-CM | POA: Diagnosis not present

## 2013-12-09 DIAGNOSIS — O4703 False labor before 37 completed weeks of gestation, third trimester: Secondary | ICD-10-CM

## 2013-12-09 LAB — OB RESULTS CONSOLE GBS: GBS: POSITIVE

## 2013-12-09 MED ORDER — NIFEDIPINE 10 MG PO CAPS
10.0000 mg | ORAL_CAPSULE | Freq: Once | ORAL | Status: AC
  Administered 2013-12-09: 10 mg via ORAL
  Filled 2013-12-09: qty 1

## 2013-12-09 MED ORDER — ZOLPIDEM TARTRATE ER 12.5 MG PO TBCR: 12.5000 mg | EXTENDED_RELEASE_TABLET | Freq: Every evening | ORAL | Status: DC | PRN

## 2013-12-09 NOTE — Discharge Instructions (Signed)
Braxton Hicks Contractions °Contractions of the uterus can occur throughout pregnancy. Contractions are not always a sign that you are in labor.  °WHAT ARE BRAXTON HICKS CONTRACTIONS?  °Contractions that occur before labor are called Braxton Hicks contractions, or false labor. Toward the end of pregnancy (32-34 weeks), these contractions can develop more often and may become more forceful. This is not true labor because these contractions do not result in opening (dilatation) and thinning of the cervix. They are sometimes difficult to tell apart from true labor because these contractions can be forceful and people have different pain tolerances. You should not feel embarrassed if you go to the hospital with false labor. Sometimes, the only way to tell if you are in true labor is for your health care provider to look for changes in the cervix. °If there are no prenatal problems or other health problems associated with the pregnancy, it is completely safe to be sent home with false labor and await the onset of true labor. °HOW CAN YOU TELL THE DIFFERENCE BETWEEN TRUE AND FALSE LABOR? °False Labor °· The contractions of false labor are usually shorter and not as hard as those of true labor.   °· The contractions are usually irregular.   °· The contractions are often felt in the front of the lower abdomen and in the groin.   °· The contractions may go away when you walk around or change positions while lying down.   °· The contractions get weaker and are shorter lasting as time goes on.   °· The contractions do not usually become progressively stronger, regular, and closer together as with true labor.   °True Labor °· Contractions in true labor last 30-70 seconds, become very regular, usually become more intense, and increase in frequency.   °· The contractions do not go away with walking.   °· The discomfort is usually felt in the top of the uterus and spreads to the lower abdomen and low back.   °· True labor can be  determined by your health care provider with an exam. This will show that the cervix is dilating and getting thinner.   °WHAT TO REMEMBER °· Keep up with your usual exercises and follow other instructions given by your health care provider.   °· Take medicines as directed by your health care provider.   °· Keep your regular prenatal appointments.   °· Eat and drink lightly if you think you are going into labor.   °· If Braxton Hicks contractions are making you uncomfortable:   °¨ Change your position from lying down or resting to walking, or from walking to resting.   °¨ Sit and rest in a tub of warm water.   °¨ Drink 2-3 glasses of water. Dehydration may cause these contractions.   °¨ Do slow and deep breathing several times an hour.   °WHEN SHOULD I SEEK IMMEDIATE MEDICAL CARE? °Seek immediate medical care if: °· Your contractions become stronger, more regular, and closer together.   °· You have fluid leaking or gushing from your vagina.   °· You have a fever.   °· You pass blood-tinged mucus.   °· You have vaginal bleeding.   °· You have continuous abdominal pain.   °· You have low back pain that you never had before.   °· You feel your baby's head pushing down and causing pelvic pressure.   °· Your baby is not moving as much as it used to.   °Document Released: 01/23/2005 Document Revised: 01/28/2013 Document Reviewed: 11/04/2012 °[employer]® Patient Information ©2015 [employer]. This information is not intended to replace advice given to you by your health care   provider. Make sure you discuss any questions you have with your health care provider. ° °

## 2013-12-09 NOTE — MAU Provider Note (Signed)
Chief Complaint:  Contractions   First Provider Initiated Contact with Patient 12/09/13 1640      HPI: Sandy Mata is a 28 y.o. G2P1001 at [redacted]w[redacted]d who presents 4 painful contractions that began at 06 30. Had office scheduled prenatal visit this morning cervix was 1 cm on vaginal exam. She's continued to have pain and also had some bloody show after the exam. Denies contractions, leakage of fluid. Good fetal movement.   Pregnancy Course: Essentially uncomplicted  Past Medical History: Past Medical History  Diagnosis Date  . Depression     quit meds with pregnancy  . History of chlamydia   . IBS (irritable bowel syndrome)   . Dermoid cyst of ovary     Past obstetric history: OB History  Gravida Para Term Preterm AB SAB TAB Ectopic Multiple Living  2 1 1  0 0 0 0 0 0 1    # Outcome Date GA Lbr Len/2nd Weight Sex Delivery Anes PTL Lv  2 Current           1 Term 02/26/11 [redacted]w[redacted]d 05:23 / 00:23 3.835 kg (8 lb 7.3 oz) F Vag-Spont EPI  Y      Past Surgical History: Past Surgical History  Procedure Laterality Date  . Cholecystectomy    . Tonsillectomy       Family History: Family History  Problem Relation Age of Onset  . Heart disease Father   . Hypertension Father   . Diabetes Father   . Crohn's disease Brother   . Diabetes Paternal Grandmother     Social History: History  Substance Use Topics  . Smoking status: Never Smoker   . Smokeless tobacco: Never Used  . Alcohol Use: No    Allergies: No Known Allergies  Meds:  Prescriptions prior to admission  Medication Sig Dispense Refill Last Dose  . acetaminophen (TYLENOL) 325 MG tablet Take 650 mg by mouth every 6 (six) hours as needed for moderate pain.   12/09/2013 at Unknown time  . Prenatal Vit-Fe Fumarate-FA (PRENATAL MULTIVITAMIN) TABS tablet Take 1 tablet by mouth daily at 12 noon.   12/08/2013 at Unknown time    ROS: Pertinent findings in history of present illness.  Physical Exam  Blood pressure 134/78, pulse  97, temperature 97.1 F (36.2 C), temperature source Oral, resp. rate 18, unknown if currently breastfeeding. GENERAL: Well-developed, well-nourished female in no acute distress.  HEENT: normocephalic HEART: normal rate RESP: normal effort ABDOMEN: Soft, non-tender, gravid appropriate for gestational age EXTREMITIES: Nontender, no edema NEURO: alert and oriented SPECULUM EXAM: NEFG, physiologic discharge, no blood, cervix clean Dilation: 2 Effacement (%): 30 Cervical Position: Posterior Station: -2 Presentation: Vertex Exam by:: AThurman Coyer, RN  FHT:  Baseline 140, moderate variability, accelerations present, no decelerations Contractions: irregular mild diminished to UI   Labs: No results found for this or any previous visit (from the past 24 hour(s)).  Imaging:  No results found. MAU Course: RN had consulted with Dr. Philis Pique and given Procardia 30mg  x1.  Pain improved after Procardia and rest. SVE at 1640: 2/L/H, slight show  Assessment: 1. False labor before 37 completed weeks of gestation in third trimester   [place] 1 FHR  Plan: Discharge home Labor precautions and fetal kick counts   Medication List    TAKE these medications        acetaminophen 325 MG tablet  Commonly known as:  TYLENOL  Take 650 mg by mouth every 6 (six) hours as needed for moderate pain.  prenatal multivitamin Tabs tablet  Take 1 tablet by mouth daily at 12 noon.     zolpidem 12.5 MG CR tablet  Commonly known as:  AMBIEN CR  Take 1 tablet (12.5 mg total) by mouth at bedtime as needed for sleep.       Follow-up Information    Follow up with Chayanne Speir A, MD.   Specialty:  Obstetrics and Gynecology   Why:  Keep your scheduled prenatal appointment   Contact information:   [place] [address] [phone]        Lorene Dy, CNM 12/09/2013 4:43 PM

## 2013-12-09 NOTE — MAU Note (Signed)
Contractions started at 0800 this morning, got worse around 1330, pt was seen at the office this morning and has progressively gotten worse.  Denies LOF/VB

## 2013-12-13 ENCOUNTER — Inpatient Hospital Stay (HOSPITAL_COMMUNITY)
Admission: AD | Admit: 2013-12-13 | Discharge: 2013-12-13 | Disposition: A | Discharge status: Home / Self Care | Payer: 59 | Source: Ambulatory Visit | Attending: Obstetrics & Gynecology | Admitting: Obstetrics & Gynecology

## 2013-12-13 ENCOUNTER — Encounter (HOSPITAL_COMMUNITY): Payer: Self-pay

## 2013-12-13 DIAGNOSIS — R109 Unspecified abdominal pain: Secondary | ICD-10-CM | POA: Diagnosis not present

## 2013-12-13 DIAGNOSIS — Z3A36 36 weeks gestation of pregnancy: Secondary | ICD-10-CM | POA: Diagnosis not present

## 2013-12-13 DIAGNOSIS — O26899 Other specified pregnancy related conditions, unspecified trimester: Secondary | ICD-10-CM

## 2013-12-13 DIAGNOSIS — O9989 Other specified diseases and conditions complicating pregnancy, childbirth and the puerperium: Secondary | ICD-10-CM

## 2013-12-13 LAB — URINALYSIS, ROUTINE W REFLEX MICROSCOPIC
BILIRUBIN URINE: NEGATIVE
GLUCOSE, UA: NEGATIVE mg/dL
HGB URINE DIPSTICK: NEGATIVE
KETONES UR: 15 mg/dL — AB
Leukocytes, UA: NEGATIVE
Nitrite: NEGATIVE
PROTEIN: NEGATIVE mg/dL
Specific Gravity, Urine: 1.025 (ref 1.005–1.030)
UROBILINOGEN UA: 0.2 mg/dL (ref 0.0–1.0)
pH: 8 (ref 5.0–8.0)

## 2013-12-13 LAB — CBC
HEMATOCRIT: 33.1 % — AB (ref 36.0–46.0)
Hemoglobin: 10.9 g/dL — ABNORMAL LOW (ref 12.0–15.0)
MCH: 28.6 pg (ref 26.0–34.0)
MCHC: 32.9 g/dL (ref 30.0–36.0)
MCV: 86.9 fL (ref 78.0–100.0)
Platelets: 217 10*3/uL (ref 150–400)
RBC: 3.81 MIL/uL — ABNORMAL LOW (ref 3.87–5.11)
RDW: 15.2 % (ref 11.5–15.5)
WBC: 14.3 10*3/uL — ABNORMAL HIGH (ref 4.0–10.5)

## 2013-12-13 MED ORDER — ACETAMINOPHEN 325 MG PO TABS
650.0000 mg | ORAL_TABLET | ORAL | Status: AC
  Administered 2013-12-13: 650 mg via ORAL
  Filled 2013-12-13: qty 2

## 2013-12-13 MED ORDER — CYCLOBENZAPRINE HCL 10 MG PO TABS: 10.0000 mg | ORAL_TABLET | Freq: Three times a day (TID) | ORAL | Status: DC | PRN

## 2013-12-13 MED ORDER — CYCLOBENZAPRINE HCL 10 MG PO TABS
10.0000 mg | ORAL_TABLET | ORAL | Status: AC
  Administered 2013-12-13: 10 mg via ORAL
  Filled 2013-12-13: qty 1

## 2013-12-13 NOTE — MAU Note (Signed)
Patient states the contractions started this morning around 0300.

## 2013-12-13 NOTE — MAU Provider Note (Signed)
Chief Complaint:  Contractions   First Provider Initiated Contact with Patient 12/13/13 1243      HPI: Sandy Mata is a 28 y.o. G2P1001 at 106w2d who presents to maternity admissions reporting right lower abdominal constant sharp pain and intermittent contractions making this pain worse.  She reports contractions every 2-5 minutes but these are mild compared to the pain her lower right. She reports the pain is sharp and radiates into her right lower back.  She denies known injury, or recent changes in activity.  She reports good fetal movement, denies LOF, vaginal bleeding, vaginal itching/burning, urinary symptoms, h/a, dizziness, n/v, or fever/chills.     Past Medical History: Past Medical History  Diagnosis Date  . Depression     quit meds with pregnancy  . History of chlamydia   . IBS (irritable bowel syndrome)   . Dermoid cyst of ovary     Past obstetric history: OB History  Gravida Para Term Preterm AB SAB TAB Ectopic Multiple Living  2 1 1  0 0 0 0 0 0 1    # Outcome Date GA Lbr Len/2nd Weight Sex Delivery Anes PTL Lv  2 Current           1 Term 02/26/11 [redacted]w[redacted]d 05:23 / 00:23 3.835 kg (8 lb 7.3 oz) F Vag-Spont EPI  Y      Past Surgical History: Past Surgical History  Procedure Laterality Date  . Cholecystectomy    . Tonsillectomy      Family History: Family History  Problem Relation Age of Onset  . Heart disease Father   . Hypertension Father   . Diabetes Father   . Crohn's disease Brother   . Diabetes Paternal Grandmother     Social History: History  Substance Use Topics  . Smoking status: Never Smoker   . Smokeless tobacco: Never Used  . Alcohol Use: No    Allergies: No Known Allergies  Meds:  No prescriptions prior to admission    ROS: Pertinent findings in history of present illness.  Physical Exam  Blood pressure 155/83, pulse 98, temperature 97.8 F (36.6 C), temperature source Oral, resp. rate 16, height 5\' 8"  (1.727 m), weight 135.342 kg  (298 lb 6 oz), not currently breastfeeding. GENERAL: Well-developed, well-nourished female in no acute distress.  HEENT: normocephalic HEART: normal rate RESP: normal effort ABDOMEN: Soft, mild tenderness in right lower abdomen/inguinal area, gravid appropriate for gestational age, no rebound tenderness, no guarding EXTREMITIES: Nontender, no edema NEURO: alert and oriented  Dilation: 1.5 Effacement (%): Thick Cervical Position: Middle Station: [place], -3 Presentation: Vertex Exam by:: Feliberto Harts RN  No cervical change in >1 hour in MAU  FHT:  Baseline 135 , moderate variability, accelerations present, no decelerations Contractions: irregular, mild to palpation   Labs:  Results for orders placed or performed during the hospital encounter of 12/13/13 (from the past 168 hour(s))  Urinalysis, Routine w reflex microscopic   Collection Time: 12/13/13 11:42 AM  Result Value Ref Range   Color, Urine YELLOW YELLOW   APPearance CLEAR CLEAR   Specific Gravity, Urine 1.025 1.005 - 1.030   pH 8.0 5.0 - 8.0   Glucose, UA NEGATIVE NEGATIVE mg/dL   Hgb urine dipstick NEGATIVE NEGATIVE   Bilirubin Urine NEGATIVE NEGATIVE   Ketones, ur 15 (A) NEGATIVE mg/dL   Protein, ur NEGATIVE NEGATIVE mg/dL   Urobilinogen, UA 0.2 0.0 - 1.0 mg/dL   Nitrite NEGATIVE NEGATIVE   Leukocytes, UA NEGATIVE NEGATIVE  CBC   Collection  Time: 12/13/13 12:58 PM  Result Value Ref Range   WBC 14.3 (H) 4.0 - 10.5 K/uL   RBC 3.81 (L) 3.87 - 5.11 MIL/uL   Hemoglobin 10.9 (L) 12.0 - 15.0 g/dL   HCT 33.1 (L) 36.0 - 46.0 %   MCV 86.9 78.0 - 100.0 fL   MCH 28.6 26.0 - 34.0 pg   MCHC 32.9 30.0 - 36.0 g/dL   RDW 15.2 11.5 - 15.5 %   Platelets 217 150 - 400 K/uL    ED Course Flexeril 10 mg x 1 dose in MAU.  Pt reports improvement of symptoms within 30 minutes of medication administration.  Assessment: 1. Abdominal pain affecting pregnancy     Plan: Consult Dr Alwyn Pea Discharge home Flexeril 10 mg TID  PRN Labor precautions and fetal kick counts Keep scheduled visits in office Return to MAU as needed for emergencies    Medication List    TAKE these medications        acetaminophen 325 MG tablet  Commonly known as:  TYLENOL  Take 650 mg by mouth every 6 (six) hours as needed for moderate pain.     calcium carbonate 500 MG chewable tablet  Commonly known as:  TUMS - dosed in mg elemental calcium  Chew 4 tablets by mouth daily.     cyclobenzaprine 10 MG tablet  Commonly known as:  FLEXERIL  Take 1 tablet (10 mg total) by mouth 3 (three) times daily as needed for muscle spasms.     prenatal multivitamin Tabs tablet  Take 1 tablet by mouth daily at 12 noon.     zolpidem 12.5 MG CR tablet  Commonly known as:  AMBIEN CR  Take 1 tablet (12.5 mg total) by mouth at bedtime as needed for sleep.        Fatima Blank Certified Nurse-Midwife 12/15/2013 12:54 PM

## 2013-12-13 NOTE — MAU Note (Signed)
Pt states has been here three times for contractions. Ctx's began this am at 0845, feel like they are back to back. Was 2cm last week.

## 2013-12-13 NOTE — Discharge Instructions (Signed)
Abdominal Pain During Pregnancy Abdominal pain is common in pregnancy. Most of the time, it does not cause harm. There are many causes of abdominal pain. Some causes are more serious than others. Some of the causes of abdominal pain in pregnancy are easily diagnosed. Occasionally, the diagnosis takes time to understand. Other times, the cause is not determined. Abdominal pain can be a sign that something is very wrong with the pregnancy, or the pain may have nothing to do with the pregnancy at all. For this reason, always tell your health care provider if you have any abdominal discomfort. HOME CARE INSTRUCTIONS  Monitor your abdominal pain for any changes. The following actions may help to alleviate any discomfort you are experiencing:  Do not have sexual intercourse or put anything in your vagina until your symptoms go away completely.  Get plenty of rest until your pain improves.  Drink clear fluids if you feel nauseous. Avoid solid food as long as you are uncomfortable or nauseous.  Only take over-the-counter or prescription medicine as directed by your health care provider.  Keep all follow-up appointments with your health care provider. SEEK IMMEDIATE MEDICAL CARE IF:  You are bleeding, leaking fluid, or passing tissue from the vagina.  You have increasing pain or cramping.  You have persistent vomiting.  You have painful or bloody urination.  You have a fever.  You notice a decrease in your baby's movements.  You have extreme weakness or feel faint.  You have shortness of breath, with or without abdominal pain.  You develop a severe headache with abdominal pain.  You have abnormal vaginal discharge with abdominal pain.  You have persistent diarrhea.  You have abdominal pain that continues even after rest, or gets worse. MAKE SURE YOU:   Understand these instructions.  Will watch your condition.  Will get help right away if you are not doing well or get  worse. Document Released: 01/23/2005 Document Revised: 11/13/2012 Document Reviewed: 08/22/2012 [facility] Patient Information 2015 [place], [place]. This information is not intended to replace advice given to you by your health care provider. Make sure you discuss any questions you have with your health care provider.  Reasons to return to MAU:  1.  Contractions are  5 minutes apart or less, each last 1 minute, these have been going on for 1-2 hours, and you cannot walk or talk during them 2.  You have a large gush of fluid, or a trickle of fluid that will not stop and you have to wear a pad 3.  You have bleeding that is bright red, heavier than spotting--like menstrual bleeding (spotting can be normal in early labor or after a check of your cervix) 4.  You do not feel the baby moving like he/she normally does

## 2013-12-15 ENCOUNTER — Inpatient Hospital Stay (HOSPITAL_COMMUNITY): Payer: 59

## 2013-12-15 ENCOUNTER — Encounter (HOSPITAL_COMMUNITY): Payer: Self-pay

## 2013-12-15 ENCOUNTER — Inpatient Hospital Stay (HOSPITAL_COMMUNITY)
Admission: AD | Admit: 2013-12-15 | Discharge: 2013-12-18 | DRG: 775 | Disposition: A | Discharge status: Home / Self Care | Payer: 59 | Source: Ambulatory Visit | Attending: Obstetrics and Gynecology | Admitting: Obstetrics and Gynecology

## 2013-12-15 DIAGNOSIS — N83201 Unspecified ovarian cyst, right side: Secondary | ICD-10-CM

## 2013-12-15 DIAGNOSIS — R52 Pain, unspecified: Secondary | ICD-10-CM

## 2013-12-15 DIAGNOSIS — N8320 Unspecified ovarian cysts: Secondary | ICD-10-CM | POA: Diagnosis present

## 2013-12-15 DIAGNOSIS — O3483 Maternal care for other abnormalities of pelvic organs, third trimester: Secondary | ICD-10-CM | POA: Diagnosis present

## 2013-12-15 DIAGNOSIS — R109 Unspecified abdominal pain: Secondary | ICD-10-CM

## 2013-12-15 DIAGNOSIS — Z3480 Encounter for supervision of other normal pregnancy, unspecified trimester: Secondary | ICD-10-CM

## 2013-12-15 DIAGNOSIS — O288 Other abnormal findings on antenatal screening of mother: Secondary | ICD-10-CM | POA: Diagnosis present

## 2013-12-15 DIAGNOSIS — O99824 Streptococcus B carrier state complicating childbirth: Secondary | ICD-10-CM | POA: Diagnosis present

## 2013-12-15 DIAGNOSIS — Z3A36 36 weeks gestation of pregnancy: Secondary | ICD-10-CM | POA: Diagnosis present

## 2013-12-15 DIAGNOSIS — IMO0001 Reserved for inherently not codable concepts without codable children: Secondary | ICD-10-CM

## 2013-12-15 DIAGNOSIS — O9989 Other specified diseases and conditions complicating pregnancy, childbirth and the puerperium: Secondary | ICD-10-CM

## 2013-12-15 DIAGNOSIS — O26899 Other specified pregnancy related conditions, unspecified trimester: Secondary | ICD-10-CM

## 2013-12-15 LAB — URINALYSIS, ROUTINE W REFLEX MICROSCOPIC
BILIRUBIN URINE: NEGATIVE
Glucose, UA: NEGATIVE mg/dL
HGB URINE DIPSTICK: NEGATIVE
Leukocytes, UA: NEGATIVE
Nitrite: NEGATIVE
PROTEIN: NEGATIVE mg/dL
SPECIFIC GRAVITY, URINE: 1.025 (ref 1.005–1.030)
UROBILINOGEN UA: 1 mg/dL (ref 0.0–1.0)
pH: 6.5 (ref 5.0–8.0)

## 2013-12-15 MED ORDER — NALBUPHINE HCL 10 MG/ML IJ SOLN
10.0000 mg | INTRAMUSCULAR | Status: DC | PRN
Start: 2013-12-15 — End: 2013-12-16
  Administered 2013-12-15 – 2013-12-16 (×2): 10 mg via INTRAVENOUS
  Filled 2013-12-15 (×2): qty 1

## 2013-12-15 MED ORDER — NALBUPHINE HCL 10 MG/ML IJ SOLN
10.0000 mg | Freq: Once | INTRAMUSCULAR | Status: AC
  Administered 2013-12-15: 10 mg via INTRAVENOUS
  Filled 2013-12-15: qty 1

## 2013-12-15 MED ORDER — ACETAMINOPHEN 325 MG PO TABS: 650.0000 mg | ORAL_TABLET | ORAL | Status: DC | PRN

## 2013-12-15 MED ORDER — ZOLPIDEM TARTRATE 5 MG PO TABS
5.0000 mg | ORAL_TABLET | Freq: Every evening | ORAL | Status: DC | PRN
  Administered 2013-12-15: 5 mg via ORAL
  Filled 2013-12-15: qty 1

## 2013-12-15 MED ORDER — LACTATED RINGERS IV SOLN
INTRAVENOUS | Status: DC
  Administered 2013-12-15 – 2013-12-16 (×2): via INTRAVENOUS
  Administered 2013-12-16: 125 mL/h via INTRAVENOUS

## 2013-12-15 MED ORDER — CALCIUM CARBONATE ANTACID 500 MG PO CHEW
2.0000 | CHEWABLE_TABLET | Freq: Two times a day (BID) | ORAL | Status: DC
  Administered 2013-12-15 – 2013-12-16 (×2): 400 mg via ORAL
  Filled 2013-12-15 (×3): qty 1

## 2013-12-15 MED ORDER — DOCUSATE SODIUM 100 MG PO CAPS
100.0000 mg | ORAL_CAPSULE | Freq: Every day | ORAL | Status: DC
  Administered 2013-12-16: 100 mg via ORAL
  Filled 2013-12-15 (×2): qty 1

## 2013-12-15 MED ORDER — FAMOTIDINE 20 MG PO TABS
20.0000 mg | ORAL_TABLET | Freq: Two times a day (BID) | ORAL | Status: DC
  Administered 2013-12-15 – 2013-12-16 (×2): 20 mg via ORAL
  Filled 2013-12-15 (×2): qty 1

## 2013-12-15 MED ORDER — LACTATED RINGERS IV BOLUS (SEPSIS)
1000.0000 mL | Freq: Once | INTRAVENOUS | Status: AC
  Administered 2013-12-15: 1000 mL via INTRAVENOUS

## 2013-12-15 MED ORDER — PRENATAL MULTIVITAMIN CH: 1.0000 | ORAL_TABLET | Freq: Every day | ORAL | Status: DC

## 2013-12-15 NOTE — H&P (Signed)
28 y.o. G2P1001 @ [redacted]w[redacted]d presents abdominal pain and back pain. She was seen 2 days ago for similar complaints. However the pain is significantly worse. She states she had a hard time getting off the couch and getting up to use the bathroom because the pain has been so severe. She denies vaginal bleeding, is feeling the baby move however feels like the movements are less. She was prescribed flexeril 2 days ago and she has been taking it around the clock. At first she felt relief and today she has gotten no relief from the medication.   On arrival to MAU, baby was noted to have minimal to moderate variability without accelerations. She was noted to be contracting q29min with significant uterine tenderness.  A limited US and BPP were performed.  The BPP was 6/8 with -2 for fetal breathing.  The [place] was significant for a simple right adnexal cyst (8.5 x 5.7 x 6.0 cm).  This cyst has been present since her last pregnancy and has been expectantly managed.  It is unchanged in size today from 2012.  Of note, no normal ovarian parenchyma could be identified due to late gestational age.  No arterial or venous blood flow to the ovary was seen.     Past Medical History  Diagnosis Date  . Depression     quit meds with pregnancy  . History of chlamydia   . IBS (irritable bowel syndrome)   . Dermoid cyst of ovary     Past Surgical History  Procedure Laterality Date  . Cholecystectomy    . Tonsillectomy      OB History  Gravida Para Term Preterm AB SAB TAB Ectopic Multiple Living  2 1 1  0 0 0 0 0 0 1    # Outcome Date GA Lbr Len/2nd Weight Sex Delivery Anes PTL Lv  2 Current           1 Term 02/26/11 [redacted]w[redacted]d 05:23 / 00:23 3.835 kg (8 lb 7.3 oz) F Vag-Spont EPI  Y      History   Social History  . Marital Status: Married    Spouse Name: N/A    Number of Children: N/A  . Years of Education: N/A   Occupational History  . Not on file.   Social History Main Topics  . Smoking status: Never Smoker   .  Smokeless tobacco: Never Used  . Alcohol Use: No  . Drug Use: No  . Sexual Activity: Yes    Birth Control/ Protection: None   Other Topics Concern  . Not on file   Social History Narrative   Review of patient's allergies indicates no known allergies.    Prenatal Transfer Tool  Maternal Diabetes: No Genetic Screening: abnormal FTS (1:183) for T21, NIPT low risk Maternal Ultrasounds/Referrals: Normal Fetal Ultrasounds or other Referrals:  None Maternal Substance Abuse:  No Significant Maternal Medications:  None Significant Maternal Lab Results: Lab values include: Group B Strep positive  Other PNC: Preeclampsia with G1     Filed Vitals:   12/15/13 1826  BP: 130/88  Pulse: 119  Temp:   Resp:      General:  Currently in NAD, just received IV pain meds Lungs: CTAB Cardiac: RRR Abdomen:  soft, gravid, contractions palpate mild, uterus relaxes between contractions.  TTP in RLQ and RUQ.  No rebound or guarding. No fundal tenderness.  Ex:  tr edema SVE:  2/long/high per Altamease Oiler, NP FHTs:  140s, mod var, no accels, no decels Toco:  q2-3 mins  Lab Results  Component Value Date   WBC 14.3* 12/13/2013   HGB 10.9* 12/13/2013   HCT 33.1* 12/13/2013   MCV 86.9 12/13/2013   PLT 217 12/13/2013     Limited US: Cephalic presentation.  AFI 16.  Normal placenta (no evidence of previa or abruption)  BPP 6/8 (-2 for fetal breathing)  Right simple adnexal cystic lesion with moderate wall thickening measuring 8.5 x 5.8 x 6.1 cm.  No venous or arterial flow.  Adnexal torsion cannot be excluded   A/P   28 y.o. [redacted]w[redacted]d  G2P1001 presents with abdominal pain, contractions, equivocal fetal testing  1. Admit to antepartum 2. Equivocal fetal testing: BPP 6/10.   Will admit for continuous fetal monitoring.  Repeat BPP in AM if non-reactive tracing overnight.  3. Abdominal pain: ddx: preterm labor, placenta abruption, ovarian torsion, appendicitis, intra-amniotic infection.  Mild  leukocytosis of 14.  Cervix is 2/50 (unchanged from last exam). Appendicitis not likely given afebrile, no nausea, vomiting, not clinically ill appearing.  The right adnexa was difficult to fully visualize due to late gestational age.  Normal ovarian parenchyma was unable to be identified, which makes visualizing blood flow to the ovary more difficult.  The mild thickening of the cyst was is concerning for intermittent or complete torsion.  However, the cyst has been persistent and unchanged in size over 3 years.  Ovarian torsion at this advanced stage of pregnancy would be very uncommon.  Ultrasound images discussed with MFM, Dr. Nelda Severe, who does not recommend delivery or surgical intervention at this time.  If the ovary is torsed, tissue necrosis may incite preterm labor.  If continued pain after delivery, could consider laparoscopy at that time.  Otherwise, delivery would be indicated for non-reassuring fetal or maternal status.  IVF and IV pain meds with continuous fetal monitoring.  Will obtain repeat cbc w diff in AM.  Plan of care discussed in detail with patient and her husband.  They are aware than if the etiology of her abdominal pain is ovarian torsion, and we cont expectant management, that she may lose the ovary.  They are in agreement to pain control and watchful waiting and do not want surgical exploration at this time.  4. Elevated BP on admission  (160/80, then 130/88).  Suspect seconday to pain.  Will trend BPs.  If cont to elevated with send preeclampsia labs.  5. GBS positive: ampicillin if labors   [place], [place]

## 2013-12-15 NOTE — MAU Note (Signed)
Report called to [facility] charge RN. Will call back with room assignment.

## 2013-12-15 NOTE — MAU Note (Signed)
Patient states she has been having pain all over the abdomen and all over her back with lower abdominal cramping since 0730 this am. Denies bleeding or leaking. Reports fetal movement but less than usual.

## 2013-12-15 NOTE — MAU Provider Note (Signed)
History     CSN: [id number]  Arrival date and time: 12/15/13 1746   First Provider Initiated Contact with Patient 12/15/13 1841      Chief Complaint  Patient presents with  . Abdominal Pain  . Back Pain   HPI   [name] is a 28 y.o. female G2P1001 at 104w4d who presents with abdominal pain and back pain. She was seen 2 days ago for similar reasons. However the pain is significantly worse.  She states she had a hard time getting off the couch and getting up to use the bathroom because the pain has been so severe. She denies vaginal bleeding, is feeling the baby move however feels like the movements are less. She was prescribed flexeril 2 days ago and she has been taking it around the clock. At first she felt relief and today she has gotten no relief from the medication.     OB History    Gravida Para Term Preterm AB TAB SAB Ectopic Multiple Living   2 1 1  0 0 0 0 0 0 1      Past Medical History  Diagnosis Date  . Depression     quit meds with pregnancy  . History of chlamydia   . IBS (irritable bowel syndrome)   . Dermoid cyst of ovary     Past Surgical History  Procedure Laterality Date  . Cholecystectomy    . Tonsillectomy      Family History  Problem Relation Age of Onset  . Heart disease Father   . Hypertension Father   . Diabetes Father   . Crohn's disease Brother   . Diabetes Paternal Grandmother     History  Substance Use Topics  . Smoking status: Never Smoker   . Smokeless tobacco: Never Used  . Alcohol Use: No    Allergies: No Known Allergies  Prescriptions prior to admission  Medication Sig Dispense Refill Last Dose  . acetaminophen (TYLENOL) 325 MG tablet Take 650 mg by mouth every 6 (six) hours as needed for moderate pain.   12/13/2013 at Unknown time  . calcium carbonate (TUMS - DOSED IN MG ELEMENTAL CALCIUM) 500 MG chewable tablet Chew 4 tablets by mouth daily.   12/13/2013 at Unknown time  . cyclobenzaprine (FLEXERIL) 10 MG tablet  Take 1 tablet (10 mg total) by mouth 3 (three) times daily as needed for muscle spasms. 30 tablet 0   . Prenatal Vit-Fe Fumarate-FA (PRENATAL MULTIVITAMIN) TABS tablet Take 1 tablet by mouth daily at 12 noon.   12/12/2013 at Unknown time  . zolpidem (AMBIEN CR) 12.5 MG CR tablet Take 1 tablet (12.5 mg total) by mouth at bedtime as needed for sleep. 5 tablet 0 12/09/2013   Results for orders placed or performed during the hospital encounter of 12/15/13 (from the past 48 hour(s))  Urinalysis, Routine w reflex microscopic     Status: Abnormal   Collection Time: 12/15/13  6:28 PM  Result Value Ref Range   Color, Urine YELLOW YELLOW   APPearance CLEAR CLEAR   Specific Gravity, Urine 1.025 1.005 - 1.030   pH 6.5 5.0 - 8.0   Glucose, UA NEGATIVE NEGATIVE mg/dL   Hgb urine dipstick NEGATIVE NEGATIVE   Bilirubin Urine NEGATIVE NEGATIVE   Ketones, ur >80 (A) NEGATIVE mg/dL   Protein, ur NEGATIVE NEGATIVE mg/dL   Urobilinogen, UA 1.0 0.0 - 1.0 mg/dL   Nitrite NEGATIVE NEGATIVE   Leukocytes, UA NEGATIVE NEGATIVE    Comment: MICROSCOPIC NOT DONE  ON URINES WITH NEGATIVE PROTEIN, BLOOD, LEUKOCYTES, NITRITE, OR GLUCOSE <1000 mg/dL.    Review of Systems  Constitutional: Negative for fever and chills.  Gastrointestinal: Positive for abdominal pain. Negative for nausea, vomiting, diarrhea and constipation.  Musculoskeletal: Positive for back pain.   Physical Exam   Blood pressure 130/88, pulse 119, temperature 98.7 F (37.1 C), temperature source Oral, resp. rate 18, SpO2 100 %.  Physical Exam  Constitutional: She is oriented to person, place, and time. She appears well-developed and well-nourished. She has a sickly appearance. She appears distressed.  HENT:  Head: Normocephalic.  Eyes: Pupils are equal, round, and reactive to light.  Neck: Neck supple.  Cardiovascular: Normal rate.   Respiratory: Effort normal.  GI: There is tenderness in the right upper quadrant, epigastric area and left upper  quadrant. There is rigidity. There is no rebound and no guarding.  Neurological: She is alert and oriented to person, place, and time.  Skin: Skin is warm. There is pallor.  Psychiatric: Her behavior is normal.     Dilation: 2 Effacement (%): Thick Station: -3 Presentation: Vertex Exam by:: Glenford Peers RN   Fetal Tracing: Baseline: 155 bpm  Variability: minimal (at times moderate) Accelerations: none Decelerations: none Toco: Abdomen palpates moderate following contraction    MAU Course  Procedures  None  MDM 1845: LR bolus, [place] at bedside.  Dr. Carlis Abbott notified of uterine activity, NST non-reactive, will inform Dr Carlis Abbott of BPP and limited US results.  Last time she had anything to eat was this morning around 0800.  Spoke to Dr. Lavinia Sharps with MFM; no blood flow to right ovary; US shows 2.8 cm right ovarian cyst.  Dr.Clark notified of critical [place] results> Dr. Carlis Abbott is on her way to MAU to discuss this with the family.  10 mg of nubain given IV BPP 6/8 Tracing reactive following IV bolus 15x15 accels with moderate variability.   Assessment and Plan   A: Abdominal pain in pregnancy Right ovarian cyst, no blood flow, possible ovarian torsion  BPP 6/8 on [place]   P: Dr. Carlis Abbott to MAU to discuss further plan of care.   Darrelyn Hillock Holston Oyama, NP 12/15/2013 8:22 PM

## 2013-12-16 ENCOUNTER — Observation Stay (HOSPITAL_COMMUNITY): Payer: 59

## 2013-12-16 ENCOUNTER — Inpatient Hospital Stay (HOSPITAL_COMMUNITY): Payer: 59 | Admitting: Anesthesiology

## 2013-12-16 ENCOUNTER — Encounter (HOSPITAL_COMMUNITY): Payer: Self-pay | Admitting: *Deleted

## 2013-12-16 DIAGNOSIS — Z3480 Encounter for supervision of other normal pregnancy, unspecified trimester: Secondary | ICD-10-CM

## 2013-12-16 DIAGNOSIS — O99824 Streptococcus B carrier state complicating childbirth: Secondary | ICD-10-CM | POA: Diagnosis present

## 2013-12-16 DIAGNOSIS — R109 Unspecified abdominal pain: Secondary | ICD-10-CM | POA: Diagnosis present

## 2013-12-16 DIAGNOSIS — IMO0001 Reserved for inherently not codable concepts without codable children: Secondary | ICD-10-CM

## 2013-12-16 DIAGNOSIS — Z3A36 36 weeks gestation of pregnancy: Secondary | ICD-10-CM | POA: Diagnosis present

## 2013-12-16 DIAGNOSIS — O3483 Maternal care for other abnormalities of pelvic organs, third trimester: Secondary | ICD-10-CM | POA: Diagnosis present

## 2013-12-16 DIAGNOSIS — N8320 Unspecified ovarian cysts: Secondary | ICD-10-CM | POA: Diagnosis present

## 2013-12-16 LAB — CBC WITH DIFFERENTIAL/PLATELET
BASOS ABS: 0 10*3/uL (ref 0.0–0.1)
BASOS PCT: 0 % (ref 0–1)
EOS PCT: 0 % (ref 0–5)
Eosinophils Absolute: 0 10*3/uL (ref 0.0–0.7)
HEMATOCRIT: 34 % — AB (ref 36.0–46.0)
Hemoglobin: 10.9 g/dL — ABNORMAL LOW (ref 12.0–15.0)
Lymphocytes Relative: 13 % (ref 12–46)
Lymphs Abs: 2.1 10*3/uL (ref 0.7–4.0)
MCH: 28.5 pg (ref 26.0–34.0)
MCHC: 32.1 g/dL (ref 30.0–36.0)
MCV: 88.8 fL (ref 78.0–100.0)
MONO ABS: 0.9 10*3/uL (ref 0.1–1.0)
Monocytes Relative: 6 % (ref 3–12)
Neutro Abs: 13.5 10*3/uL — ABNORMAL HIGH (ref 1.7–7.7)
Neutrophils Relative %: 81 % — ABNORMAL HIGH (ref 43–77)
PLATELETS: 235 10*3/uL (ref 150–400)
RBC: 3.83 MIL/uL — ABNORMAL LOW (ref 3.87–5.11)
RDW: 15.6 % — AB (ref 11.5–15.5)
WBC: 16.5 10*3/uL — ABNORMAL HIGH (ref 4.0–10.5)

## 2013-12-16 LAB — TYPE AND SCREEN
ABO/RH(D): O POS
ANTIBODY SCREEN: NEGATIVE

## 2013-12-16 LAB — COMPREHENSIVE METABOLIC PANEL
ALBUMIN: 2.5 g/dL — AB (ref 3.5–5.2)
ALT: 11 U/L (ref 0–35)
AST: 12 U/L (ref 0–37)
Alkaline Phosphatase: 129 U/L — ABNORMAL HIGH (ref 39–117)
Anion gap: 14 (ref 5–15)
BUN: 9 mg/dL (ref 6–23)
CALCIUM: 9.5 mg/dL (ref 8.4–10.5)
CO2: 21 mEq/L (ref 19–32)
CREATININE: 0.79 mg/dL (ref 0.50–1.10)
Chloride: 101 mEq/L (ref 96–112)
GFR calc Af Amer: >90 mL/min (ref >90)
GFR calc non Af Amer: >90 mL/min (ref >90)
Glucose, Bld: 104 mg/dL — ABNORMAL HIGH (ref 70–99)
Potassium: 4.2 mEq/L (ref 3.7–5.3)
Sodium: 136 mEq/L — ABNORMAL LOW (ref 137–147)
TOTAL PROTEIN: 6.8 g/dL (ref 6.0–8.3)
Total Bilirubin: 0.7 mg/dL (ref 0.3–1.2)

## 2013-12-16 LAB — PROTEIN / CREATININE RATIO, URINE
CREATININE, URINE: 504.35 mg/dL
PROTEIN CREATININE RATIO: 0.21 — AB (ref 0.00–0.15)
TOTAL PROTEIN, URINE: 108.3 mg/dL

## 2013-12-16 LAB — URIC ACID: URIC ACID, SERUM: 5.6 mg/dL (ref 2.4–7.0)

## 2013-12-16 LAB — LACTATE DEHYDROGENASE: LDH: 148 U/L (ref 94–250)

## 2013-12-16 LAB — RPR

## 2013-12-16 MED ORDER — CITRIC ACID-SODIUM CITRATE 334-500 MG/5ML PO SOLN: 30.0000 mL | ORAL | Status: DC | PRN

## 2013-12-16 MED ORDER — PENICILLIN G POTASSIUM 5000000 UNITS IJ SOLR
5.0000 10*6.[IU] | Freq: Once | INTRAVENOUS | Status: AC
  Administered 2013-12-16: 5 10*6.[IU] via INTRAVENOUS
  Filled 2013-12-16: qty 5

## 2013-12-16 MED ORDER — DIPHENHYDRAMINE HCL 50 MG/ML IJ SOLN: 12.5000 mg | INTRAMUSCULAR | Status: DC | PRN

## 2013-12-16 MED ORDER — WITCH HAZEL-GLYCERIN EX PADS: 1.0000 "application " | MEDICATED_PAD | CUTANEOUS | Status: DC | PRN

## 2013-12-16 MED ORDER — MEASLES, MUMPS & RUBELLA VAC ~~LOC~~ INJ: 0.5000 mL | INJECTION | Freq: Once | SUBCUTANEOUS | Status: DC

## 2013-12-16 MED ORDER — TERBUTALINE SULFATE 1 MG/ML IJ SOLN: 0.2500 mg | Freq: Once | INTRAMUSCULAR | Status: DC | PRN

## 2013-12-16 MED ORDER — DIBUCAINE 1 % RE OINT: 1.0000 "application " | TOPICAL_OINTMENT | RECTAL | Status: DC | PRN

## 2013-12-16 MED ORDER — OXYTOCIN 40 UNITS IN LACTATED RINGERS INFUSION - SIMPLE MED: 62.5000 mL/h | INTRAVENOUS | Status: DC

## 2013-12-16 MED ORDER — OXYTOCIN BOLUS FROM INFUSION: 500.0000 mL | INTRAVENOUS | Status: DC

## 2013-12-16 MED ORDER — OXYCODONE-ACETAMINOPHEN 5-325 MG PO TABS
1.0000 | ORAL_TABLET | ORAL | Status: DC | PRN
  Administered 2013-12-16: 1 via ORAL
  Filled 2013-12-16: qty 1

## 2013-12-16 MED ORDER — PHENYLEPHRINE 40 MCG/ML (10ML) SYRINGE FOR IV PUSH (FOR BLOOD PRESSURE SUPPORT)
80.0000 ug | PREFILLED_SYRINGE | INTRAVENOUS | Status: DC | PRN
  Filled 2013-12-16: qty 2

## 2013-12-16 MED ORDER — OXYCODONE-ACETAMINOPHEN 5-325 MG PO TABS
1.0000 | ORAL_TABLET | ORAL | Status: DC | PRN
Start: 2013-12-16 — End: 2013-12-16

## 2013-12-16 MED ORDER — ONDANSETRON HCL 4 MG PO TABS: 4.0000 mg | ORAL_TABLET | ORAL | Status: DC | PRN

## 2013-12-16 MED ORDER — EPHEDRINE 5 MG/ML INJ
10.0000 mg | INTRAVENOUS | Status: DC | PRN
  Filled 2013-12-16: qty 2

## 2013-12-16 MED ORDER — NALBUPHINE HCL 10 MG/ML IJ SOLN
10.0000 mg | INTRAMUSCULAR | Status: DC | PRN
  Administered 2013-12-16 (×2): 10 mg via INTRAVENOUS
  Filled 2013-12-16 (×2): qty 1

## 2013-12-16 MED ORDER — TETANUS-DIPHTH-ACELL PERTUSSIS 5-2.5-18.5 LF-MCG/0.5 IM SUSP
0.5000 mL | Freq: Once | INTRAMUSCULAR | Status: AC
  Administered 2013-12-17: 0.5 mL via INTRAMUSCULAR
  Filled 2013-12-16: qty 0.5

## 2013-12-16 MED ORDER — SENNOSIDES-DOCUSATE SODIUM 8.6-50 MG PO TABS
2.0000 | ORAL_TABLET | ORAL | Status: DC
  Administered 2013-12-17 (×2): 2 via ORAL
  Filled 2013-12-16 (×2): qty 2

## 2013-12-16 MED ORDER — DEXTROSE 5 % IV SOLN
2.5000 10*6.[IU] | INTRAVENOUS | Status: DC
  Administered 2013-12-16: 2.5 10*6.[IU] via INTRAVENOUS
  Filled 2013-12-16 (×4): qty 2.5

## 2013-12-16 MED ORDER — OXYCODONE-ACETAMINOPHEN 5-325 MG PO TABS
2.0000 | ORAL_TABLET | ORAL | Status: DC | PRN
  Administered 2013-12-17 – 2013-12-18 (×6): 2 via ORAL
  Filled 2013-12-16 (×6): qty 2

## 2013-12-16 MED ORDER — ONDANSETRON HCL 4 MG/2ML IJ SOLN: 4.0000 mg | Freq: Four times a day (QID) | INTRAMUSCULAR | Status: DC | PRN

## 2013-12-16 MED ORDER — PHENYLEPHRINE 40 MCG/ML (10ML) SYRINGE FOR IV PUSH (FOR BLOOD PRESSURE SUPPORT)
80.0000 ug | PREFILLED_SYRINGE | INTRAVENOUS | Status: DC | PRN
  Filled 2013-12-16: qty 2
  Filled 2013-12-16: qty 10

## 2013-12-16 MED ORDER — LACTATED RINGERS IV SOLN: 500.0000 mL | INTRAVENOUS | Status: DC | PRN

## 2013-12-16 MED ORDER — IBUPROFEN 600 MG PO TABS
600.0000 mg | ORAL_TABLET | Freq: Four times a day (QID) | ORAL | Status: DC
  Administered 2013-12-17 – 2013-12-18 (×7): 600 mg via ORAL
  Filled 2013-12-16 (×7): qty 1

## 2013-12-16 MED ORDER — ONDANSETRON HCL 4 MG/2ML IJ SOLN: 4.0000 mg | INTRAMUSCULAR | Status: DC | PRN

## 2013-12-16 MED ORDER — LIDOCAINE HCL (PF) 1 % IJ SOLN
30.0000 mL | INTRAMUSCULAR | Status: DC | PRN
  Filled 2013-12-16: qty 30

## 2013-12-16 MED ORDER — ACETAMINOPHEN 325 MG PO TABS: 650.0000 mg | ORAL_TABLET | ORAL | Status: DC | PRN

## 2013-12-16 MED ORDER — FENTANYL 2.5 MCG/ML BUPIVACAINE 1/10 % EPIDURAL INFUSION (WH - ANES)
14.0000 mL/h | INTRAMUSCULAR | Status: DC | PRN
  Administered 2013-12-16: 14 mL/h via EPIDURAL
  Filled 2013-12-16: qty 125

## 2013-12-16 MED ORDER — SIMETHICONE 80 MG PO CHEW
80.0000 mg | CHEWABLE_TABLET | ORAL | Status: DC | PRN
Start: 2013-12-16 — End: 2013-12-18

## 2013-12-16 MED ORDER — BENZOCAINE-MENTHOL 20-0.5 % EX AERO
1.0000 "application " | INHALATION_SPRAY | CUTANEOUS | Status: DC | PRN
  Administered 2013-12-16 – 2013-12-18 (×3): 1 via TOPICAL
  Filled 2013-12-16 (×3): qty 56

## 2013-12-16 MED ORDER — LIDOCAINE HCL (PF) 1 % IJ SOLN
INTRAMUSCULAR | Status: DC | PRN
  Administered 2013-12-16 (×2): 5 mL

## 2013-12-16 MED ORDER — OXYTOCIN 40 UNITS IN LACTATED RINGERS INFUSION - SIMPLE MED
1.0000 m[IU]/min | INTRAVENOUS | Status: DC
  Administered 2013-12-16: 2 m[IU]/min via INTRAVENOUS
  Filled 2013-12-16: qty 1000

## 2013-12-16 MED ORDER — OXYCODONE-ACETAMINOPHEN 5-325 MG PO TABS: 2.0000 | ORAL_TABLET | ORAL | Status: DC | PRN

## 2013-12-16 MED ORDER — OXYCODONE-ACETAMINOPHEN 5-325 MG PO TABS: 1.0000 | ORAL_TABLET | ORAL | Status: DC | PRN

## 2013-12-16 MED ORDER — ZOLPIDEM TARTRATE 5 MG PO TABS
5.0000 mg | ORAL_TABLET | Freq: Every evening | ORAL | Status: DC | PRN
Start: 2013-12-16 — End: 2013-12-18

## 2013-12-16 MED ORDER — LACTATED RINGERS IV SOLN: INTRAVENOUS | Status: DC

## 2013-12-16 MED ORDER — LACTATED RINGERS IV SOLN: 500.0000 mL | Freq: Once | INTRAVENOUS | Status: DC

## 2013-12-16 NOTE — Plan of Care (Signed)
Problem: Phase I Progression Outcomes Goal: Induction meds as ordered Outcome: Completed/Met Date Met:  12/16/13

## 2013-12-16 NOTE — Anesthesia Preprocedure Evaluation (Signed)
Anesthesia Evaluation  Patient identified by MRN, date of birth, ID band Patient awake    Reviewed: Allergy & Precautions, H&P , Patient's Chart, lab work & pertinent test results  Airway Mallampati: III  TM Distance: >3 FB Neck ROM: full    Dental   Pulmonary  breath sounds clear to auscultation        Cardiovascular Rhythm:regular Rate:Normal     Neuro/Psych PSYCHIATRIC DISORDERS    GI/Hepatic   Endo/Other  Morbid obesity  Renal/GU      Musculoskeletal   Abdominal   Peds  Hematology   Anesthesia Other Findings   Reproductive/Obstetrics (+) Pregnancy                             Anesthesia Physical Anesthesia Plan  ASA: III  Anesthesia Plan: Epidural   Post-op Pain Management:    Induction:   Airway Management Planned:   Additional Equipment:   Intra-op Plan:   Post-operative Plan:   Informed Consent: I have reviewed the patients History and Physical, chart, labs and discussed the procedure including the risks, benefits and alternatives for the proposed anesthesia with the patient or authorized representative who has indicated his/her understanding and acceptance.     Plan Discussed with:   Anesthesia Plan Comments:         Anesthesia Quick Evaluation

## 2013-12-16 NOTE — Lactation Note (Signed)
This note was copied from the chart of Boy Jola Critzer. Lactation Consultation Note  Patient Name: Boy Noni Stonesifer [id number] Date: 12/16/2013 Reason for consult: Initial assessment;Other (Comment);Late preterm infant (sleepy, spitty baby) Mom attempted to breastfeed her first child, (now 30 yo) but she would not latch and mom did not consider pumping at that time.  For this new baby, she wants to breastfeed but if unable to latch, she wants to pump and feed ebm by bottle.  Mom has short/everted nipples with wide base.  LC assisted mom with hand expressing drops, demonstrated spoon feeding briefly but baby is spitty and turns away from breast and spoon.  He is 5 hours old and weight is >8 lbs despite 36 weeks and 5 days gestation.  Mom thinks her dates may have been wrong and baby does not meet criteria for blood sugar testing at this time.  LC provided shells, hand pump and a #24 NS with instructions for use later (including written handout for NS use) .  LC also discussed recommendations and plan with RN, Tia.  LC encouraged frequent STS and cue feedings. Mom encouraged to feed baby 8-12 times/24 hours and with feeding cues. LC encouraged review of Baby and Me pp 9, 14 and 20-25 for STS and BF information. LC provided [employer] brochure and reviewed [facility] services and list of community and web site resources.    Maternal Data Formula Feeding for Exclusion: No Has patient been taught Hand Expression?: Yes (LC demonstrated and drops visible) Does the patient have breastfeeding experience prior to this delivery?: Yes  Feeding Feeding Type:  (no feeding in birthing suites)  LATCH Score/Interventions           no latch yet, baby sleepy and spitty           Lactation Tools Discussed/Used Hand pump, shells for inverted nipples (mom has short nipples which evert with stimulation) Initiated by:: Junious Dresser, RN, IBCLC Date initiated:: 12/16/13  #24 NS STS, hand expression and spoon  feeding, cue feeding at breast Recommend upright positioning while baby is spitty  Consult Status   LC to follow-up tomorrow   Bernita Buffy 12/16/2013, 9:53 PM

## 2013-12-16 NOTE — Plan of Care (Signed)
Problem: Phase I Progression Outcomes Goal: Pain controlled with appropriate interventions Outcome: Completed/Met Date Met:  12/16/13

## 2013-12-16 NOTE — Progress Notes (Signed)
   12/16/13 1500  Clinical Encounter Type  Visited With Health care provider  Referral From Nurse (via Safety Rounds)   Consulted with Wendall Papa, RN (charge) and Duffy Rhody, RN (bedside) regarding Ms Cellucci's ovarian pain.  Spiritual Care is aware of tentative plan to remove ovary during this hospital stay and plans to visit with patient/family to introduce support after delivery, per RN's recommendation, given labor progression and pt's current coping.  Please page 24/7 if situation changes or need becomes more acute: [phone].  Thank you.  [place], [place]

## 2013-12-16 NOTE — Anesthesia Procedure Notes (Signed)
Epidural Patient location during procedure: OB Start time: 12/16/2013 11:59 AM  Staffing Anesthesiologist: Rudean Curt Performed by: anesthesiologist   Preanesthetic Checklist Completed: patient identified, site marked, surgical consent, pre-op evaluation, timeout performed, IV checked, risks and benefits discussed and monitors and equipment checked  Epidural Patient position: sitting Prep: site prepped and draped and DuraPrep Patient monitoring: continuous pulse ox and blood pressure Approach: midline Location: L3-L4 Injection technique: LOR air  Needle:  Needle type: Tuohy  Needle gauge: 17 G Needle length: 9 cm and 9 Needle insertion depth: 7 cm Catheter type: closed end flexible Catheter size: 19 Gauge Catheter at skin depth: 12 cm Test dose: negative  Assessment Events: blood not aspirated, injection not painful, no injection resistance, negative IV test and no paresthesia  Additional Notes Patient identified.  Risk benefits discussed including failed block, incomplete pain control, headache, nerve damage, paralysis, blood pressure changes, nausea, vomiting, reactions to medication both toxic or allergic, and postpartum back pain.  Patient expressed understanding and wished to proceed.  All questions were answered.  Sterile technique used throughout procedure and epidural site dressed with sterile barrier dressing. No paresthesia or other complications noted.The patient did not experience any signs of intravascular injection such as tinnitus or metallic taste in mouth nor signs of intrathecal spread such as rapid motor block. Please see nursing notes for vital signs.

## 2013-12-16 NOTE — Plan of Care (Signed)
Problem: Phase I Progression Outcomes Goal: Medications/IV Fluids N/A Outcome: Completed/Met Date Met:  12/16/13     

## 2013-12-16 NOTE — Plan of Care (Signed)
Problem: Phase I Progression Outcomes Goal: Pitocin as ordered Outcome: Completed/Met Date Met:  12/16/13

## 2013-12-16 NOTE — Plan of Care (Signed)
Problem: Phase I Progression Outcomes Goal: FHR checked 5 minutes after meds (ROM) Rupture of Membranes Outcome: Completed/Met Date Met:  12/16/13     

## 2013-12-16 NOTE — Plan of Care (Signed)
Problem: Phase I Progression Outcomes Goal: OOB as tolerated unless otherwise ordered Outcome: Completed/Met Date Met:  12/16/13 Goal: IV Pain medications as ordered Outcome: Completed/Met Date Met:  12/16/13

## 2013-12-16 NOTE — Plan of Care (Signed)
Problem: Phase I Progression Outcomes Goal: Assess per MD/Nurse,Routine-VS,FHR,UC,Head to Toe assess Outcome: Completed/Met Date Met:  12/16/13

## 2013-12-16 NOTE — Progress Notes (Signed)
Pt was seen by me this am and was still in severe pain. She could not lay down. She was contracting q2-3 min. My intitial thought was to consider aspiration of the cyst to relieve a possible torsion. Prior to doing this I ordered another u/s to confirm no blood flow. With this u/s she had blood flow seen, however the cyst was noted to have nodularity and a septation. The radiologist had concern for a possible malignancy. When she was rechecked she had changed her cx and was in labor. I had discussed this pt with Dr. Venetia Maxon and he was concerned about the torsion and continued obs. I decided to deliver the pt.

## 2013-12-16 NOTE — Progress Notes (Signed)
Pt back in bed after going to the bathroom; larger cuff placed on pt's rt arm; will retake bp in 82min

## 2013-12-16 NOTE — Plan of Care (Signed)
Problem: Phase I Progression Outcomes Goal: Obtain and review prenatal records Outcome: Completed/Met Date Met:  12/16/13

## 2013-12-17 LAB — CA 125: CA 125: 32 U/mL — AB (ref <35)

## 2013-12-17 NOTE — Plan of Care (Signed)
Problem: Phase I Progression Outcomes Goal: Voiding adequately Outcome: Completed/Met Date Met:  12/17/13     

## 2013-12-17 NOTE — Anesthesia Postprocedure Evaluation (Signed)
Anesthesia Post Note  Patient: Sandy Mata  Procedure(s) Performed: * No procedures listed *  Anesthesia type: Epidural  Patient location: Mother/Baby  Post pain: Pain level controlled  Post assessment: Post-op Vital signs reviewed  Last Vitals:  Filed Vitals:   12/17/13 0030  BP: 127/78  Pulse: 106  Temp: 36.9 C  Resp: 18    Post vital signs: Reviewed  Level of consciousness: awake  Complications: No apparent anesthesia complications

## 2013-12-17 NOTE — Progress Notes (Signed)
Patient is eating, ambulating, voiding.  Pt is still experiencing RLQ pain likely d/t known cyst.  Lochia appropriate, no other complaints.  Filed Vitals:   12/16/13 2015 12/16/13 2116 12/17/13 0030 12/17/13 0945  BP: 145/80 146/78 127/78 114/63  Pulse: 106 115 106 92  Temp: 99.5 F (37.5 C) 98.7 F (37.1 C) 98.4 F (36.9 C) 98.3 F (36.8 C)  TempSrc: Oral Oral Oral Axillary  Resp: 20 18 18 18   Height:      Weight:      SpO2:        Fundus firm, NT Abd: RLQ TTP Ext: no CT  Lab Results  Component Value Date   WBC 16.5* 12/16/2013   HGB 10.9* 12/16/2013   HCT 34.0* 12/16/2013   MCV 88.8 12/16/2013   PLT 235 12/16/2013    --/--/O POS (11/09 1925)  A/P Post partum day 1. Circ in office Will review office records and given [place] finding worrisome for malignancy, will discuss with gyn onc and develop plan for removal. Continue oral pain management.  Routine care.  Allyn Kenner

## 2013-12-17 NOTE — Plan of Care (Signed)
Problem: Consults Goal: Postpartum Patient Education (See Patient Education module for education specifics.)  Outcome: Progressing  Problem: Phase I Progression Outcomes Goal: Pain controlled with appropriate interventions Outcome: Completed/Met Date Met:  12/17/13  Problem: Phase II Progression Outcomes Goal: Pain controlled on oral analgesia Outcome: Completed/Met Date Met:  12/17/13 Goal: Progress activity as tolerated unless otherwise ordered Outcome: Completed/Met Date Met:  12/17/13 Goal: Afebrile, VS remain stable Outcome: Completed/Met Date Met:  12/17/13 Goal: Tolerating diet Outcome: Completed/Met Date Met:  12/17/13 Goal: Other Phase II Outcomes/Goals Outcome: Completed/Met Date Met:  12/17/13  Problem: Discharge Progression Outcomes Goal: Activity appropriate for discharge plan Outcome: Completed/Met Date Met:  12/17/13 Goal: Tolerating diet Outcome: Completed/Met Date Met:  12/17/13 Goal: Discharge plan in place and appropriate Outcome: Completed/Met Date Met:  12/17/13

## 2013-12-17 NOTE — Plan of Care (Signed)
Problem: Phase I Progression Outcomes Goal: OOB as tolerated unless otherwise ordered Outcome: Completed/Met Date Met:  12/17/13 Goal: VS, stable, temp < 100.4 degrees F Outcome: Completed/Met Date Met:  12/17/13 Goal: Initial discharge plan identified Outcome: Completed/Met Date Met:  12/17/13 Goal: Other Phase I Outcomes/Goals Outcome: Not Applicable Date Met:  39/67/28

## 2013-12-17 NOTE — Lactation Note (Signed)
This note was copied from the chart of Sandy Carleta Woodrow. Lactation Consultation Note  Baby was very sleepy.  Attempted latch with #24 nipple shield without success.  Oral assessment reveals tongue humping and thrusting.  Double electric breast pump set up and late preterm protocol initiated.  Patient Name: Sandy Mata Date: 12/17/2013     Maternal Data    This Feeding occurred after the consult Feeding Type: Breast Fed (mother pumped after feeding but did not get any breastmilk.) Length of feed: 20 min  LATCH Score/Interventions Latch: Repeated attempts needed to sustain latch, nipple held in mouth throughout feeding, stimulation needed to elicit sucking reflex.  Audible Swallowing: A few with stimulation Intervention(s): Skin to skin  Type of Nipple: Flat Intervention(s): Double electric pump Intervention(s): Double electric pump  Comfort (Breast/Nipple): Soft / non-tender     Hold (Positioning): No assistance needed to correctly position infant at breast.  LATCH Score: 7  Lactation Tools Discussed/Used     Consult Status      Sandy Mata 12/17/2013, 5:18 PM

## 2013-12-18 NOTE — Lactation Note (Signed)
This note was copied from the chart of Sandy Mata. Lactation Consultation Note  Patient Name: Sandy Mata [id number] Date: 12/18/2013 Reason for consult: Follow-up assessment Baby 41 hours of life. Mom reports baby nursing well on left breast, nipple everts after baby latches. Assisted mom to latch baby to right breast in football position. Baby not able to latch well, nipple flat and tissue not easily compressed. Mom had been using a #24 NS, refitted with #20 NS, baby tolerated well, latching deeply with a few suckles noted. Baby has a few swallows, and there is colostrum in the NS. Enc mom to continue to attempt to nurse with #20 NS on right breast, and to use personal DEBP for additional stimulation to protect milk supply. Enc mom to offer baby left breast after the right breast as mom states baby latches well and she hears swallows from left breast. Enc mom to nurse with cues, and to attempt to latch without NS. Discussed with mom that NS is temporary bridge to assist until baby able to latch directly to breast. Enc frequent weight checks for baby, and mom aware of OP/BFSG and [place] phone line assistance after discharge. Referred mom to Baby and Me booklet for number of diapers to expect by day of life and EBM storage guidelines. Enc mom to call out for assistance as needed, and to call [facility] phone line for help after discharge.   Maternal Data    Feeding Feeding Type: Breast Fed  LATCH Score/Interventions Latch: Repeated attempts needed to sustain latch, nipple held in mouth throughout feeding, stimulation needed to elicit sucking reflex.  Audible Swallowing: A few with stimulation  Type of Nipple: Flat  Comfort (Breast/Nipple): Soft / non-tender     Hold (Positioning): No assistance needed to correctly position infant at breast.  LATCH Score: 7  Lactation Tools Discussed/Used Tools: Nipple Shields Nipple shield size: 20   Consult Status Consult Status:  Complete    Shaunak Kreis 12/18/2013, 10:01 AM

## 2013-12-18 NOTE — Progress Notes (Signed)
   12/18/13 1000  Clinical Encounter Type  Visited With Patient and family together (husband Sandy Mata and baby Sandy Mata (spelling?))  Spiritual Encounters  Spiritual Needs Emotional   Spiritual Care has been following from a distance, given stressor of ovarian cyst and planned surgery, on top of birth of new baby.  Met Sandy Mata and Sandy Mata today to offer support.  They were in good spirits and looking forward to going home.  Per pt, she's not worried about cyst because surgery should be straightforward (laparoscopic/outpatient) and MDs "aren't worried about it."  Reminded family of ongoing Spiritual Care availability for support, especially if they'd find it helpful during surgical stay.  [place], [place]

## 2013-12-18 NOTE — Discharge Summary (Addendum)
Obstetric Discharge Summary Reason for Admission: onset of labor Prenatal Procedures: ultrasound Intrapartum Procedures: spontaneous vaginal delivery Postpartum Procedures: 2 nd degree midline Complications-Operative and Postpartum: none HEMOGLOBIN  Date Value Ref Range Status  12/16/2013 10.9* 12.0 - 15.0 g/dL Final   HCT  Date Value Ref Range Status  12/16/2013 34.0* 36.0 - 46.0 % Final   Discharge Diagnoses: Term Pregnancy-delivered and R Ovarian cyst  Discharge Information: Date: 12/18/2013 Activity: pelvic rest Diet: routine Medications: Ibuprofen Condition: stable Instructions: refer to practice specific booklet Discharge to: home Follow-up Information    Follow up with Espen Bethel A, MD In 2 weeks.   Specialty:  Obstetrics and Gynecology   Contact information:   [place] [address] [phone]       Newborn Data: Live born female  Birth Weight: 8 lb 2 oz (3685 g) APGAR: 9, 9  Home with mother.  Sandy Mata A 12/18/2013, 8:41 AM

## 2013-12-18 NOTE — Progress Notes (Signed)
Patient is eating, ambulating, voiding.  Pain control is good.  Filed Vitals:   12/17/13 0945 12/17/13 1721 12/18/13 0030 12/18/13 0518  BP: 114/63 130/86 105/64 117/66  Pulse: 92 99 79 77  Temp: 98.3 F (36.8 C) 97.6 F (36.4 C) 98 F (36.7 C) 97.5 F (36.4 C)  TempSrc: Axillary Oral Oral Oral  Resp: 18 18 18 18   Height:      Weight:      SpO2:   99% 100%    Fundus firm Perineum without swelling.  Lab Results  Component Value Date   WBC 16.5* 12/16/2013   HGB 10.9* 12/16/2013   HCT 34.0* 12/16/2013   MCV 88.8 12/16/2013   PLT 235 12/16/2013    --/--/O POS (11/09 1925)/RI  A/P Post partum day 2 with 9 cm R Ovarian cyst- increased in size about 2 cm; formerly looked like endometrioma, now is mostly clear with small areas of peripheral nodularity and one septation.  Pain is continuing.  CA 125 33.  Will have Gyn Onc evaluate and recommend type of removal.   Otherwise routine care.  Expect d/c today or tomorrow.    Jonea Bukowski A

## 2014-01-14 ENCOUNTER — Encounter (HOSPITAL_COMMUNITY): Payer: Self-pay | Admitting: *Deleted

## 2014-01-20 ENCOUNTER — Other Ambulatory Visit: Payer: Self-pay | Admitting: Obstetrics and Gynecology

## 2014-01-20 NOTE — H&P (Addendum)
28 y.o. yo [id number] complains of persistent R ovarian mass that has increased in size and complexity.  Cyst originally found in first pregnacy and was watched postpartum- originally 6 cm and simple.  Pt then became pregnant a second time and by the end of that pregnancy the cyst was 10x6x6 with a single thin septation and some internal nodularity.  The cyst was also beginning to cause pain.  Doppler studies showed no increase in blood flow and CA 125 was borderline at 33.   Pt wished to have ovary removed - on [place] only a thin rim of ovarian tissue is identifiable.  Pt also wishes to have birth control with Mirena.  Past Medical History  Diagnosis Date  . Depression     quit meds with pregnancy  . History of chlamydia   . IBS (irritable bowel syndrome)   . Dermoid cyst of ovary   . Medical history non-contributory    Past Surgical History  Procedure Laterality Date  . Cholecystectomy    . Tonsillectomy      History   Social History  . Marital Status: Married    Spouse Name: N/A    Number of Children: N/A  . Years of Education: N/A   Occupational History  . Not on file.   Social History Main Topics  . Smoking status: Never Smoker   . Smokeless tobacco: Never Used  . Alcohol Use: No  . Drug Use: No  . Sexual Activity: Yes    Birth Control/ Protection: None   Other Topics Concern  . Not on file   Social History Narrative    No current facility-administered medications on file prior to encounter.   Current Outpatient Prescriptions on File Prior to Encounter  Medication Sig Dispense Refill  . cyclobenzaprine (FLEXERIL) 10 MG tablet Take 1 tablet (10 mg total) by mouth 3 (three) times daily as needed for muscle spasms. 30 tablet 0  . Prenatal Vit-Fe Fumarate-FA (PRENATAL MULTIVITAMIN) TABS tablet Take 1 tablet by mouth daily at 12 noon.    Marland Kitchen zolpidem (AMBIEN CR) 12.5 MG CR tablet Take 1 tablet (12.5 mg total) by mouth at bedtime as needed for sleep. 5 tablet 0    No Known  Allergies  @VITALS2 @  Lungs: clear to ascultation Cor:  RRR Abdomen:  soft, nontender, nondistended. Ex:  no cords, erythema Pelvic:  NEFG, mass on right mobile; normal s/s uterus.  A:  Enlarging R ovarian cyst with some mural nodularity, thin septation, borderline CA 125 and no other abnormalities; desires birth control.  D/w case over phone with gyn onc and they agree with proceeding with R oopherectomy, possible salpingectomy.  Will accept referral if found to be cancer but not high probability.  Pt also desires birth control with Mirena.   P:All risks, benefits and alternatives d/w patient and she desires to proceed with a R oopherectomy with robotic assistance and Mirena placement.  Patient has undergone a modified bowel prep and will receive preop antibiotics and SCDs during the operation.     Ann Groeneveld A

## 2014-01-21 ENCOUNTER — Ambulatory Visit (HOSPITAL_COMMUNITY): Payer: 59 | Admitting: Anesthesiology

## 2014-01-21 ENCOUNTER — Encounter (HOSPITAL_COMMUNITY)
Admission: RE | Disposition: A | Discharge status: Home / Self Care | Payer: Self-pay | Source: Ambulatory Visit | Attending: Obstetrics and Gynecology

## 2014-01-21 ENCOUNTER — Ambulatory Visit (HOSPITAL_COMMUNITY)
Admission: RE | Admit: 2014-01-21 | Discharge: 2014-01-21 | Disposition: A | Discharge status: Home / Self Care | Payer: 59 | Source: Ambulatory Visit | Attending: Obstetrics and Gynecology | Admitting: Obstetrics and Gynecology

## 2014-01-21 DIAGNOSIS — F329 Major depressive disorder, single episode, unspecified: Secondary | ICD-10-CM | POA: Diagnosis not present

## 2014-01-21 DIAGNOSIS — N8320 Unspecified ovarian cysts: Secondary | ICD-10-CM | POA: Insufficient documentation

## 2014-01-21 DIAGNOSIS — N858 Other specified noninflammatory disorders of uterus: Secondary | ICD-10-CM | POA: Insufficient documentation

## 2014-01-21 DIAGNOSIS — N831 Corpus luteum cyst: Secondary | ICD-10-CM | POA: Diagnosis not present

## 2014-01-21 DIAGNOSIS — K589 Irritable bowel syndrome without diarrhea: Secondary | ICD-10-CM | POA: Insufficient documentation

## 2014-01-21 DIAGNOSIS — Z6841 Body Mass Index (BMI) 40.0 and over, adult: Secondary | ICD-10-CM | POA: Insufficient documentation

## 2014-01-21 DIAGNOSIS — Z9049 Acquired absence of other specified parts of digestive tract: Secondary | ICD-10-CM | POA: Diagnosis not present

## 2014-01-21 HISTORY — DX: Other specified health status: Z78.9

## 2014-01-21 HISTORY — PX: UNILATERAL SALPINGECTOMY: SHX6160

## 2014-01-21 HISTORY — PX: ROBOTIC ASSISTED LAPAROSCOPIC OVARIAN CYSTECTOMY: SHX6081

## 2014-01-21 LAB — CBC
HCT: 37.1 % (ref 36.0–46.0)
HEMOGLOBIN: 11.6 g/dL — AB (ref 12.0–15.0)
MCH: 26.7 pg (ref 26.0–34.0)
MCHC: 31.3 g/dL (ref 30.0–36.0)
MCV: 85.5 fL (ref 78.0–100.0)
Platelets: 322 10*3/uL (ref 150–400)
RBC: 4.34 MIL/uL (ref 3.87–5.11)
RDW: 14.5 % (ref 11.5–15.5)
WBC: 8.7 10*3/uL (ref 4.0–10.5)

## 2014-01-21 LAB — PREGNANCY, URINE: PREG TEST UR: NEGATIVE

## 2014-01-21 SURGERY — ROBOTIC ASSISTED LAPAROSCOPIC OVARIAN CYSTECTOMY
Anesthesia: General | Site: Abdomen | Laterality: Right

## 2014-01-21 MED ORDER — OXYCODONE-ACETAMINOPHEN 5-325 MG PO TABS
ORAL_TABLET | ORAL | Status: AC
  Administered 2014-01-21: 1 via ORAL
  Filled 2014-01-21: qty 1

## 2014-01-21 MED ORDER — LACTATED RINGERS IV SOLN
INTRAVENOUS | Status: DC
  Administered 2014-01-21 (×3): via INTRAVENOUS

## 2014-01-21 MED ORDER — KETOROLAC TROMETHAMINE 30 MG/ML IJ SOLN
INTRAMUSCULAR | Status: DC | PRN
  Administered 2014-01-21: 30 mg via INTRAVENOUS

## 2014-01-21 MED ORDER — DEXAMETHASONE SODIUM PHOSPHATE 10 MG/ML IJ SOLN
INTRAMUSCULAR | Status: AC
  Filled 2014-01-21: qty 1

## 2014-01-21 MED ORDER — MIDAZOLAM HCL 5 MG/5ML IJ SOLN
INTRAMUSCULAR | Status: DC | PRN
  Administered 2014-01-21: 2 mg via INTRAVENOUS

## 2014-01-21 MED ORDER — OXYCODONE-ACETAMINOPHEN 5-325 MG PO TABS
1.0000 | ORAL_TABLET | Freq: Once | ORAL | Status: AC
Start: 2014-01-21 — End: 2014-01-21
  Administered 2014-01-21: 1 via ORAL

## 2014-01-21 MED ORDER — STERILE WATER FOR IRRIGATION IR SOLN
Status: DC | PRN
Start: 2014-01-21 — End: 2014-01-21
  Administered 2014-01-21: 3000 mL

## 2014-01-21 MED ORDER — LIDOCAINE HCL (CARDIAC) 20 MG/ML IV SOLN
INTRAVENOUS | Status: DC | PRN
  Administered 2014-01-21: 60 mg via INTRAVENOUS

## 2014-01-21 MED ORDER — LIDOCAINE HCL (CARDIAC) 20 MG/ML IV SOLN
INTRAVENOUS | Status: AC
  Filled 2014-01-21: qty 5

## 2014-01-21 MED ORDER — FENTANYL CITRATE 0.05 MG/ML IJ SOLN
25.0000 ug | INTRAMUSCULAR | Status: DC | PRN
  Administered 2014-01-21: 25 ug via INTRAVENOUS
  Administered 2014-01-21: 50 ug via INTRAVENOUS

## 2014-01-21 MED ORDER — LACTATED RINGERS IR SOLN
Status: DC | PRN
  Administered 2014-01-21: 1

## 2014-01-21 MED ORDER — OXYCODONE-ACETAMINOPHEN 5-325 MG PO TABS: 1.0000 | ORAL_TABLET | ORAL | Status: DC | PRN

## 2014-01-21 MED ORDER — MIDAZOLAM HCL 2 MG/2ML IJ SOLN: 0.5000 mg | Freq: Once | INTRAMUSCULAR | Status: DC | PRN

## 2014-01-21 MED ORDER — GLYCOPYRROLATE 0.2 MG/ML IJ SOLN
INTRAMUSCULAR | Status: DC | PRN
  Administered 2014-01-21: 0.6 mg via INTRAVENOUS

## 2014-01-21 MED ORDER — KETOROLAC TROMETHAMINE 30 MG/ML IJ SOLN: 15.0000 mg | Freq: Once | INTRAMUSCULAR | Status: DC | PRN

## 2014-01-21 MED ORDER — ONDANSETRON HCL 4 MG/2ML IJ SOLN
INTRAMUSCULAR | Status: DC | PRN
  Administered 2014-01-21: 4 mg via INTRAVENOUS

## 2014-01-21 MED ORDER — MIDAZOLAM HCL 2 MG/2ML IJ SOLN
INTRAMUSCULAR | Status: AC
  Filled 2014-01-21: qty 2

## 2014-01-21 MED ORDER — GLYCOPYRROLATE 0.2 MG/ML IJ SOLN
INTRAMUSCULAR | Status: AC
  Filled 2014-01-21: qty 4

## 2014-01-21 MED ORDER — FENTANYL CITRATE 0.05 MG/ML IJ SOLN
INTRAMUSCULAR | Status: AC
  Filled 2014-01-21: qty 2

## 2014-01-21 MED ORDER — MEPERIDINE HCL 25 MG/ML IJ SOLN: 6.2500 mg | INTRAMUSCULAR | Status: DC | PRN

## 2014-01-21 MED ORDER — DEXAMETHASONE SODIUM PHOSPHATE 10 MG/ML IJ SOLN
INTRAMUSCULAR | Status: DC | PRN
  Administered 2014-01-21: 4 mg via INTRAVENOUS

## 2014-01-21 MED ORDER — NEOSTIGMINE METHYLSULFATE 10 MG/10ML IV SOLN
INTRAVENOUS | Status: DC | PRN
Start: 2014-01-21 — End: 2014-01-21
  Administered 2014-01-21: 4 mg via INTRAVENOUS

## 2014-01-21 MED ORDER — NEOSTIGMINE METHYLSULFATE 10 MG/10ML IV SOLN
INTRAVENOUS | Status: AC
  Filled 2014-01-21: qty 1

## 2014-01-21 MED ORDER — ROCURONIUM BROMIDE 100 MG/10ML IV SOLN
INTRAVENOUS | Status: DC | PRN
  Administered 2014-01-21: 50 mg via INTRAVENOUS

## 2014-01-21 MED ORDER — PROMETHAZINE HCL 25 MG/ML IJ SOLN
6.2500 mg | INTRAMUSCULAR | Status: DC | PRN
Start: 2014-01-21 — End: 2014-01-21

## 2014-01-21 MED ORDER — SCOPOLAMINE 1 MG/3DAYS TD PT72
1.0000 | MEDICATED_PATCH | Freq: Once | TRANSDERMAL | Status: DC
  Administered 2014-01-21: 1.5 mg via TRANSDERMAL

## 2014-01-21 MED ORDER — KETOROLAC TROMETHAMINE 30 MG/ML IJ SOLN
INTRAMUSCULAR | Status: AC
  Filled 2014-01-21: qty 1

## 2014-01-21 MED ORDER — BUPIVACAINE LIPOSOME 1.3 % IJ SUSP
Freq: Once | INTRAMUSCULAR | Status: DC
  Filled 2014-01-21: qty 20

## 2014-01-21 MED ORDER — ONDANSETRON HCL 4 MG/2ML IJ SOLN
INTRAMUSCULAR | Status: AC
Start: 2014-01-21 — End: 2014-01-21
  Filled 2014-01-21: qty 2

## 2014-01-21 MED ORDER — FENTANYL CITRATE 0.05 MG/ML IJ SOLN
INTRAMUSCULAR | Status: AC
  Filled 2014-01-21: qty 5

## 2014-01-21 MED ORDER — DEXAMETHASONE SODIUM PHOSPHATE 4 MG/ML IJ SOLN
INTRAMUSCULAR | Status: AC
  Filled 2014-01-21: qty 1

## 2014-01-21 MED ORDER — SCOPOLAMINE 1 MG/3DAYS TD PT72
MEDICATED_PATCH | TRANSDERMAL | Status: AC
  Filled 2014-01-21: qty 1

## 2014-01-21 MED ORDER — FENTANYL CITRATE 0.05 MG/ML IJ SOLN
INTRAMUSCULAR | Status: DC | PRN
  Administered 2014-01-21 (×2): 50 ug via INTRAVENOUS
  Administered 2014-01-21: 150 ug via INTRAVENOUS
  Administered 2014-01-21 (×2): 50 ug via INTRAVENOUS

## 2014-01-21 MED ORDER — ARTIFICIAL TEARS OP OINT
TOPICAL_OINTMENT | OPHTHALMIC | Status: DC | PRN
  Administered 2014-01-21: 1 via OPHTHALMIC

## 2014-01-21 MED ORDER — PROPOFOL 10 MG/ML IV EMUL
INTRAVENOUS | Status: AC
  Filled 2014-01-21: qty 20

## 2014-01-21 SURGICAL SUPPLY — 62 items
ADH SKN CLS LQ APL DERMABOND (GAUZE/BANDAGES/DRESSINGS) ×3
APL SKNCLS STERI-STRIP NONHPOA (GAUZE/BANDAGES/DRESSINGS) ×3
BARRIER ADHS 3X4 INTERCEED (GAUZE/BANDAGES/DRESSINGS) ×5 IMPLANT
BENZOIN TINCTURE PRP APPL 2/3 (GAUZE/BANDAGES/DRESSINGS) ×5 IMPLANT
BRR ADH 4X3 ABS CNTRL BYND (GAUZE/BANDAGES/DRESSINGS) ×3
CHLORAPREP W/TINT 26ML (MISCELLANEOUS) ×5 IMPLANT
CLOSURE WOUND 1/2 X4 (GAUZE/BANDAGES/DRESSINGS) ×1
CLOTH BEACON ORANGE TIMEOUT ST (SAFETY) ×5 IMPLANT
CONT PATH 16OZ SNAP LID 3702 (MISCELLANEOUS) ×13 IMPLANT
COVER BACK TABLE 60X90IN (DRAPES) ×10 IMPLANT
COVER TIP SHEARS 8 DVNC (MISCELLANEOUS) ×3 IMPLANT
COVER TIP SHEARS 8MM DA VINCI (MISCELLANEOUS) ×2
DECANTER SPIKE VIAL GLASS SM (MISCELLANEOUS) ×5 IMPLANT
DERMABOND ADHESIVE PROPEN (GAUZE/BANDAGES/DRESSINGS) ×2
DERMABOND ADVANCED .7 DNX6 (GAUZE/BANDAGES/DRESSINGS) ×2 IMPLANT
DRAPE WARM FLUID 44X44 (DRAPE) ×5 IMPLANT
DRSG COVADERM PLUS 2X2 (GAUZE/BANDAGES/DRESSINGS) ×4 IMPLANT
DRSG OPSITE POSTOP 3X4 (GAUZE/BANDAGES/DRESSINGS) ×4 IMPLANT
ELECT REM PT RETURN 9FT ADLT (ELECTROSURGICAL) ×5
ELECTRODE REM PT RTRN 9FT ADLT (ELECTROSURGICAL) ×3 IMPLANT
EVACUATOR SMOKE 8.L (FILTER) ×5 IMPLANT
GAUZE VASELINE 3X9 (GAUZE/BANDAGES/DRESSINGS) IMPLANT
GLOVE BIO SURGEON STRL SZ7 (GLOVE) ×10 IMPLANT
GLOVE ECLIPSE 6.5 STRL STRAW (GLOVE) ×15 IMPLANT
GOWN STRL REUS W/TWL LRG LVL3 (GOWN DISPOSABLE) ×40 IMPLANT
KIT ACCESSORY DA VINCI DISP (KITS) ×2
KIT ACCESSORY DVNC DISP (KITS) ×3 IMPLANT
LEGGING LITHOTOMY PAIR STRL (DRAPES) ×5 IMPLANT
LIQUID BAND (GAUZE/BANDAGES/DRESSINGS) ×5 IMPLANT
MANIPULATOR UTERINE 4.5 ZUMI (MISCELLANEOUS) IMPLANT
NEEDLE INSUFFLATION 120MM (ENDOMECHANICALS) ×5 IMPLANT
NS IRRIG 1000ML POUR BTL (IV SOLUTION) ×15 IMPLANT
OCCLUDER COLPOPNEUMO (BALLOONS) ×10 IMPLANT
PACK ROBOT WH (CUSTOM PROCEDURE TRAY) ×5 IMPLANT
PAD PREP 24X48 CUFFED NSTRL (MISCELLANEOUS) ×10 IMPLANT
PAD TRENDELENBURG POSITION (MISCELLANEOUS) ×5 IMPLANT
POUCH ENDO CATCH II 15MM (MISCELLANEOUS) ×4 IMPLANT
PROTECTOR NERVE ULNAR (MISCELLANEOUS) ×10 IMPLANT
SCISSORS LAP 5X45 EPIX DISP (ENDOMECHANICALS) ×4 IMPLANT
SET CYSTO W/LG BORE CLAMP LF (SET/KITS/TRAYS/PACK) IMPLANT
SET IRRIG TUBING LAPAROSCOPIC (IRRIGATION / IRRIGATOR) ×5 IMPLANT
SOLUTION ANTI FOG 6CC (MISCELLANEOUS) ×4 IMPLANT
STRIP CLOSURE SKIN 1/2X4 (GAUZE/BANDAGES/DRESSINGS) ×4 IMPLANT
SUT VIC AB 0 CT1 27 (SUTURE) ×25
SUT VIC AB 0 CT1 27XBRD ANBCTR (SUTURE) ×15 IMPLANT
SUT VIC AB 2-0 CT2 27 (SUTURE) ×10 IMPLANT
SUT VICRYL 0 UR6 27IN ABS (SUTURE) ×10 IMPLANT
SUT VICRYL RAPIDE 3 0 (SUTURE) ×10 IMPLANT
SYR 50ML LL SCALE MARK (SYRINGE) ×5 IMPLANT
SYSTEM CONVERTIBLE TROCAR (TROCAR) IMPLANT
TIP UTERINE 5.1X6CM LAV DISP (MISCELLANEOUS) IMPLANT
TIP UTERINE 6.7X10CM GRN DISP (MISCELLANEOUS) IMPLANT
TIP UTERINE 6.7X6CM WHT DISP (MISCELLANEOUS) IMPLANT
TIP UTERINE 6.7X8CM BLUE DISP (MISCELLANEOUS) IMPLANT
TOWEL OR 17X24 6PK STRL BLUE (TOWEL DISPOSABLE) ×10 IMPLANT
TRAY FOLEY CATH 14FR (SET/KITS/TRAYS/PACK) ×5 IMPLANT
TROCAR DILATING TIP 12MM 150MM (ENDOMECHANICALS) IMPLANT
TROCAR DISP BLADELESS 8 DVNC (TROCAR) ×3 IMPLANT
TROCAR DISP BLADELESS 8MM (TROCAR) ×2
TROCAR OPTI TIP 12M 100M (ENDOMECHANICALS) IMPLANT
TROCAR XCEL 12X100 BLDLESS (ENDOMECHANICALS) ×5 IMPLANT
TROCAR XCEL NON-BLD 5MMX100MML (ENDOMECHANICALS) ×5 IMPLANT

## 2014-01-21 NOTE — Progress Notes (Signed)
There has been no change in the patients history, status or exam since the history and physical.  Filed Vitals:   01/21/14 1025  BP: 134/91  Pulse: 65  Temp: 98.1 F (36.7 C)  TempSrc: Oral  Resp: 16  Height: 5\' 8"  (1.727 m)  Weight: 120.203 kg (265 lb)  SpO2: 100%    Lab Results  Component Value Date   WBC 8.7 01/21/2014   HGB 11.6* 01/21/2014   HCT 37.1 01/21/2014   MCV 85.5 01/21/2014   PLT 322 01/21/2014    Sandy Mata A

## 2014-01-21 NOTE — Op Note (Signed)
01/21/2014  1:29 PM  PATIENT:  Sandy Mata  28 y.o. female  PRE-OPERATIVE DIAGNOSIS:  Complex Ovarian Cyst  POST-OPERATIVE DIAGNOSIS:  Complex Ovarian Cyst  PROCEDURE:  R ovarian cystectomy and salpingectomy  SURGEON:  Surgeon(s) and Role:    * Daria Pastures, MD - Primary    * Jerelyn Charles, MD - Assisting   ANESTHESIA:   general  EBL:  Total I/O In: 1000 [I.V.:1000] Out: - EBL 50 cc, Urine normal amt   LOCAL MEDICATIONS USED:  OTHER interceed  SPECIMEN:  Source of Specimen:  R ovarian cyst and tube  DISPOSITION OF SPECIMEN:  PATHOLOGY  COUNTS:  YES  TOURNIQUET:  * No tourniquets in log *  DICTATION: .Note written in EPIC  PLAN OF CARE: Discharge to home after PACU  PATIENT DISPOSITION:  PACU - hemodynamically stable.   Delay start of Pharmacological VTE agent (>24hrs) due to surgical blood loss or risk of bleeding: not applicable  Complications:  None.  Findings:  12 cm R Ovary with adhesions to peritoneum and omentum all the way up on the peritoneum of the R anterior abdominal wall.  Some adhesions of ascending colon to peritoneum.  Liver edge and cul de sac were otherwise normal.  The ureters were seen well out of all fields of dissection.  Part of the right ovary was completely normal and the tube was adhesed to the mass.  The cyst was breeched and darkish endometriosis looking fluid spilled from it.  The appendix was normal in appearance.  Meds: Interceed  Technique:  After adequate general anesthesia was achieved, the patient was prepped and draped in sterile fashion.  The speculum was placed into the vagina and the uterine manipulator placed in the cervix. The speculum was removed and a catheter placed to drain the bladder during the surgery.  Attention was turned to the abdomen and a  2 cm incision was made above the umbilicus.  The veress needle passed into the abdomen without aspiration of bowel contents or blood.  The 12 mm trocar for the camera port  was introduced after insuflatation and the above findings noted.  Two 8.5 mm trocars were introduced 10 cm either side of the camera port under direct visualization.  The PK forceps were introduced on arm 2 and the Hot Shears on arm 1.      The adhesions of the omentum were removed from the ovary with cautery and the cyst was peeled off the peritoneum and retracted inferiorly.  The IP ligament was identified and isolated from the cyst.  Cautery with the hot shears and PK was used to cut across the end of the ovary with the cyst.  The tubal end was bleeding and a small piece was excised off the end to achieve hemostasis.  The ureter was seen well below th field of dissection.  The cyst was then placed in the cul de sac.  Hemostasis was assured with the insufflation down.  The Robot was then undocked.  The 8.5 mm scope was introduced and the ovary and tube were placed inside an endobag.  The bag was then brought through the umbilical incision and opened to the outside.  The ovary was removed with an allis clamp in pieces from the bag.  Fluid, ovary and tube were all successfully removed from the abdomen and sent to pathology.  A piece of interceed was then placed over the peritoneal defect.       All instruments were removed and the  abdomen desuflated.  The 12 mm trocar site fascia was closed with a figure of eight stitch of 2-Vicryl.  All skin incisions were closed with subcuticular stitches and Dermabond.  All instruments were withdrawn from the vagina.  Pt tolerated the procedure well and was returned to the recovery room in stable condition.    Dawnetta Copenhaver A

## 2014-01-21 NOTE — Transfer of Care (Signed)
Immediate Anesthesia Transfer of Care Note  Patient: Sandy Mata  Procedure(s) Performed: Procedure(s): ROBOTIC ASSISTED SALPINGO OOPHORECTOMY (Right)  Patient Location: PACU  Anesthesia Type:General  Level of Consciousness: awake, alert  and oriented  Airway & Oxygen Therapy: Patient Spontanous Breathing and Patient connected to nasal cannula oxygen  Post-op Assessment: Report given to PACU RN and Post -op Vital signs reviewed and stable  Post vital signs: Reviewed and stable  Complications: No apparent anesthesia complications

## 2014-01-21 NOTE — Anesthesia Postprocedure Evaluation (Signed)
  Anesthesia Post-op Note  Anesthesia Post Note  Patient: Sandy Mata  Procedure(s) Performed: Procedure(s) (LRB): ROBOTIC ASSISTED LAPAROSCOPIC OVARIAN CYSTECTOMY (Right) UNILATERAL SALPINGECTOMY (Right)  Anesthesia type: General  Patient location: PACU  Post pain: Pain level controlled  Post assessment: Post-op Vital signs reviewed  Last Vitals:  Filed Vitals:   01/21/14 1630  BP: 127/71  Pulse: 85  Temp:   Resp: 20    Post vital signs: Reviewed  Level of consciousness: sedated  Complications: No apparent anesthesia complications

## 2014-01-21 NOTE — Anesthesia Preprocedure Evaluation (Signed)
Anesthesia Evaluation  Patient identified by MRN, date of birth, ID band Patient awake    Reviewed: Allergy & Precautions, H&P , Patient's Chart, lab work & pertinent test results, reviewed documented beta blocker date and time   History of Anesthesia Complications Negative for: history of anesthetic complications  Airway Mallampati: III  TM Distance: >3 FB Neck ROM: full    Dental   Pulmonary  breath sounds clear to auscultation        Cardiovascular Exercise Tolerance: Good Rhythm:regular Rate:Normal     Neuro/Psych    GI/Hepatic   Endo/Other  Morbid obesity  Renal/GU      Musculoskeletal   Abdominal   Peds  Hematology   Anesthesia Other Findings   Reproductive/Obstetrics                             Anesthesia Physical Anesthesia Plan  ASA: III  Anesthesia Plan: General ETT   Post-op Pain Management:    Induction:   Airway Management Planned:   Additional Equipment:   Intra-op Plan:   Post-operative Plan:   Informed Consent: I have reviewed the patients History and Physical, chart, labs and discussed the procedure including the risks, benefits and alternatives for the proposed anesthesia with the patient or authorized representative who has indicated his/her understanding and acceptance.   Dental Advisory Given  Plan Discussed with: CRNA and Surgeon  Anesthesia Plan Comments:         Anesthesia Quick Evaluation

## 2014-01-21 NOTE — Brief Op Note (Signed)
01/21/2014  1:29 PM  PATIENT:  Sandy Mata  28 y.o. female  PRE-OPERATIVE DIAGNOSIS:  Complex Ovarian Cyst  POST-OPERATIVE DIAGNOSIS:  Complex Ovarian Cyst  PROCEDURE:  R ovarian cystectomy and salpingectomy  SURGEON:  Surgeon(s) and Role:    * Daria Pastures, MD - Primary    * Jerelyn Charles, MD - Assisting   ANESTHESIA:   general  EBL:  Total I/O In: 1000 [I.V.:1000] Out: - EBL 50 cc, Urine normal amt   LOCAL MEDICATIONS USED:  OTHER interceed  SPECIMEN:  Source of Specimen:  R ovarian cyst and tube  DISPOSITION OF SPECIMEN:  PATHOLOGY  COUNTS:  YES  TOURNIQUET:  * No tourniquets in log *  DICTATION: .Note written in EPIC  PLAN OF CARE: Discharge to home after PACU  PATIENT DISPOSITION:  PACU - hemodynamically stable.   Delay start of Pharmacological VTE agent (>24hrs) due to surgical blood loss or risk of bleeding: not applicable  Complications:  None.  Findings:  12 cm R Ovary with adhesions to peritoneum and omentum all the way up on the peritoneum of the R anterior abdominal wall.  Some adhesions of ascending colon to peritoneum.  Liver edge and cul de sac were otherwise normal.  The ureters were seen well out of all fields of dissection.  Part of the right ovary was completely normal and the tube was adhesed to the mass.  The cyst was breeched and darkish endometriosis looking fluid spilled from it.  The appendix was normal in appearance.  Meds: Interceed  Technique:  After adequate general anesthesia was achieved, the patient was prepped and draped in sterile fashion.  The speculum was placed into the vagina and the uterine manipulator placed in the cervix. The speculum was removed and a catheter placed to drain the bladder during the surgery.  Attention was turned to the abdomen and a  2 cm incision was made above the umbilicus.  The veress needle passed into the abdomen without aspiration of bowel contents or blood.  The 12 mm trocar for the camera port  was introduced after insuflatation and the above findings noted.  Two 8.5 mm trocars were introduced 10 cm either side of the camera port under direct visualization.  The PK forceps were introduced on arm 2 and the Hot Shears on arm 1.      The adhesions of the omentum were removed from the ovary with cautery and the cyst was peeled off the peritoneum and retracted inferiorly.  The IP ligament was identified and isolated from the cyst.  Cautery with the hot shears and PK was used to cut across the end of the ovary with the cyst.  The tubal end was bleeding and a small piece was excised off the end to achieve hemostasis.  The ureter was seen well below th field of dissection.  The cyst was then placed in the cul de sac.  Hemostasis was assured with the insufflation down.  The Robot was then undocked.  The 8.5 mm scope was introduced and the ovary and tube were placed inside an endobag.  The bag was then brought through the umbilical incision and opened to the outside.  The ovary was removed with an allis clamp in pieces from the bag.  Fluid, ovary and tube were all successfully removed from the abdomen and sent to pathology.  A piece of interceed was then placed over the peritoneal defect.       All instruments were removed and the  abdomen desuflated.  The 12 mm trocar site fascia was closed with a figure of eight stitch of 2-Vicryl.  All skin incisions were closed with subcuticular stitches and Dermabond.  All instruments were withdrawn from the vagina.  Pt tolerated the procedure well and was returned to the recovery room in stable condition.    Shelden Raborn A

## 2014-01-21 NOTE — Anesthesia Procedure Notes (Signed)
Procedure Name: Intubation Date/Time: 01/21/2014 11:58 AM Performed by: Eleah Lahaie, Sheron Nightingale Pre-anesthesia Checklist: Patient identified, Timeout performed, Emergency Drugs available, Suction available and Patient being monitored Patient Re-evaluated:Patient Re-evaluated prior to inductionOxygen Delivery Method: Circle system utilized Preoxygenation: Pre-oxygenation with 100% oxygen Intubation Type: IV induction Ventilation: Mask ventilation without difficulty Laryngoscope Size: Mac and 3 Grade View: Grade I Tube size: 7.0 mm Number of attempts: 1 Placement Confirmation: ETT inserted through vocal cords under direct vision,  breath sounds checked- equal and bilateral and positive ETCO2 Secured at: 22 cm Dental Injury: Teeth and Oropharynx as per pre-operative assessment

## 2014-01-21 NOTE — Discharge Instructions (Signed)

## 2014-01-22 ENCOUNTER — Encounter (HOSPITAL_COMMUNITY): Payer: Self-pay | Admitting: Obstetrics and Gynecology

## 2014-04-09 ENCOUNTER — Encounter (INDEPENDENT_AMBULATORY_CARE_PROVIDER_SITE_OTHER): Payer: Self-pay | Admitting: Internal Medicine

## 2014-04-09 ENCOUNTER — Ambulatory Visit (INDEPENDENT_AMBULATORY_CARE_PROVIDER_SITE_OTHER): Payer: 59 | Admitting: Internal Medicine

## 2014-04-09 VITALS — BP 112/64 | HR 76 | Temp 97.8°F | Ht 68.0 in | Wt 261.6 lb

## 2014-04-09 DIAGNOSIS — K589 Irritable bowel syndrome without diarrhea: Secondary | ICD-10-CM

## 2014-04-09 MED ORDER — DICYCLOMINE HCL 10 MG PO CAPS: 10.0000 mg | ORAL_CAPSULE | Freq: Two times a day (BID) | ORAL | Status: DC

## 2014-04-09 NOTE — Progress Notes (Signed)
   Subjective:    Patient ID: Sandy Mata, female    DOB: 1986/01/22, 29 y.o.   MRN: [id number]  HPI Referred to our office by Dr. Raliegh Ip. Howard for diarrhea/urgency/incontience. She says she has a hx of IBS in the past. She says she says sometimes she cannot make it to the bathroom. She says; she has diarrhea but it has been worse in the past 3 weeks. Stools are loose. 90% of the time her stools are loose.  She has Activa yogurt which has not helped. No recent antibiotics. Appetite is good. Weight loss intentional. She is 4 months post-partum vaginal delivery. She usually has a BM every other day. Stools x 2-3 stools a day. Family hx of Crohns in a brother diagnosed at age 70 which per notes is aggressive.  Colonoscopy in past [facility] 6 yrs ago for same symptoms. States she had polyps.    Review of Systems Past Medical History  Diagnosis Date  . Depression     quit meds with pregnancy  . History of chlamydia   . IBS (irritable bowel syndrome)   . Dermoid cyst of ovary   . Medical history non-contributory   . Diarrhea     Past Surgical History  Procedure Laterality Date  . Cholecystectomy    . Tonsillectomy    . Robotic assisted laparoscopic ovarian cystectomy Right 01/21/2014    Procedure: ROBOTIC ASSISTED LAPAROSCOPIC OVARIAN CYSTECTOMY;  Surgeon: Daria Pastures, MD;  Location: [facility];  Service: Gynecology;  Laterality: Right;  . Unilateral salpingectomy Right 01/21/2014    Procedure: UNILATERAL SALPINGECTOMY;  Surgeon: Daria Pastures, MD;  Location: [facility];  Service: Gynecology;  Laterality: Right;    No Known Allergies  No current outpatient prescriptions on file prior to visit.   No current facility-administered medications on file prior to visit.        Objective:   Physical Exam Blood pressure 112/64, pulse 76, temperature 97.8 F (36.6 C), height 5\' 8"  (1.727 m), weight 261 lb 9.6 oz (118.661 kg), unknown if currently breastfeeding. Alert and oriented.  Skin warm and dry. Oral mucosa is moist.   . Sclera anicteric, conjunctivae is pink. Thyroid not enlarged. No cervical lymphadenopathy. Lungs clear. Heart regular rate and rhythm.  Abdomen is soft. Bowel sounds are positive. No hepatomegaly. No abdominal masses felt. No tenderness.  No edema to lower extremities.          Assessment & Plan:  Probable IBS.  Will get stool studies. Start on Dicyclomine BID . Try IBGARD for a couple of days to see how it works before starting the [place].  Will get colonoscopy report from Dr. Nadara Mustard or [facility]. If she had polyps will need colonoscopy.  IBGARD samples OV in 2 months.

## 2014-04-09 NOTE — Patient Instructions (Signed)
GI pathogen. Dicyclomine twice. One in am and one in pm. IBGard samples x 7

## 2014-04-21 LAB — OTHER SOLSTAS TEST

## 2014-05-21 ENCOUNTER — Telehealth (INDEPENDENT_AMBULATORY_CARE_PROVIDER_SITE_OTHER): Payer: Self-pay | Admitting: Internal Medicine

## 2014-05-21 ENCOUNTER — Encounter (INDEPENDENT_AMBULATORY_CARE_PROVIDER_SITE_OTHER): Payer: Self-pay

## 2014-05-21 NOTE — Telephone Encounter (Signed)
Colonoscopy 08/31/2006 Dr. Laurence Spates, [facility]: Colonoscopy: There perianal and digital rectal examinations were normal. Internal, medium sized hemorrhoids were found during retroflexion.  Biopsy: Benign colonic mucosa. No significant inflammation or other abnormalities identified.

## 2014-06-09 ENCOUNTER — Ambulatory Visit (INDEPENDENT_AMBULATORY_CARE_PROVIDER_SITE_OTHER): Payer: 59 | Admitting: Internal Medicine

## 2014-07-08 ENCOUNTER — Encounter (INDEPENDENT_AMBULATORY_CARE_PROVIDER_SITE_OTHER): Payer: Self-pay | Admitting: *Deleted

## 2015-06-20 IMAGING — US US PELVIS LIMITED
2 series · 13 of 25 positions shown · non-contrast
Comparison: 12/15/2013

CLINICAL DATA: Right lateral abdominal pain, cystic right ovarian
mass on ultrasound, 15w8d pregnant

EXAM:
LIMITED ULTRASOUND OF PELVIS
DOPPLER ULTRASOUND OF OVARIES
TECHNIQUE: Limited transabdominal ultrasound examination of the pelvis was
performed to evaluate the ovaries and adnexa regions only.
Color and duplex Doppler ultrasound was utilized to evaluate blood
flow to the ovaries.

[Series 1: us pelvis limited · 2 of 4 slices shown (1 of 2)]
[im 1/4]
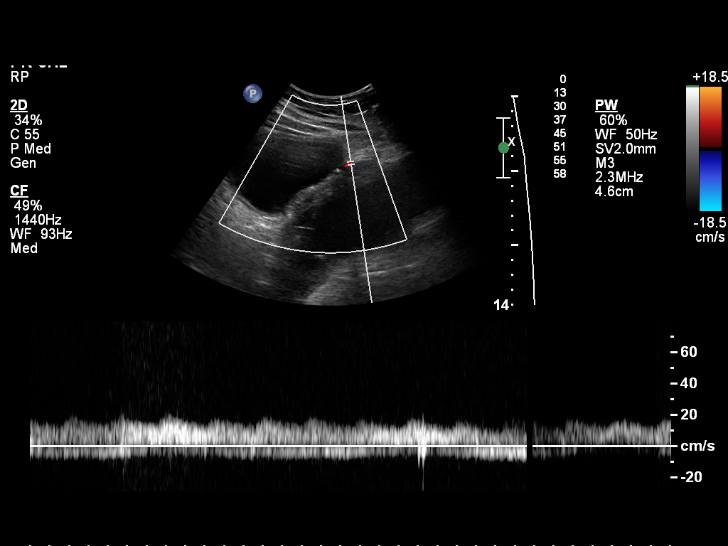
[im 3/4]
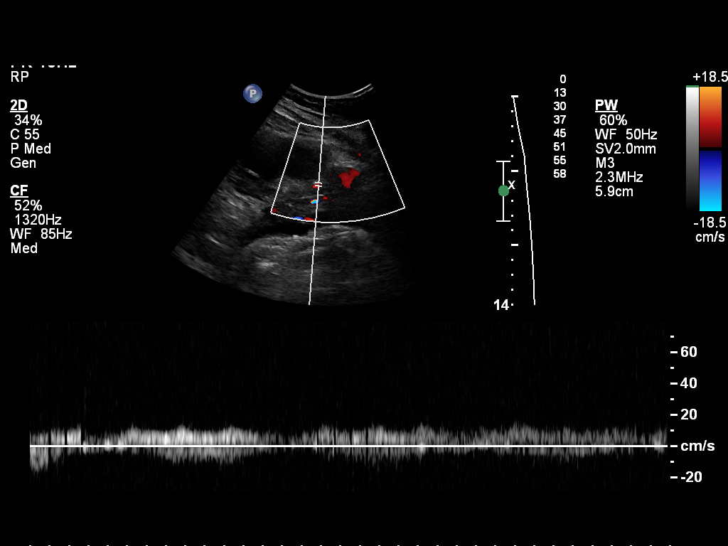

[Series 1: us pelvis limited · 11 of 21 slices shown (2 of 2)]
[im 1/21]
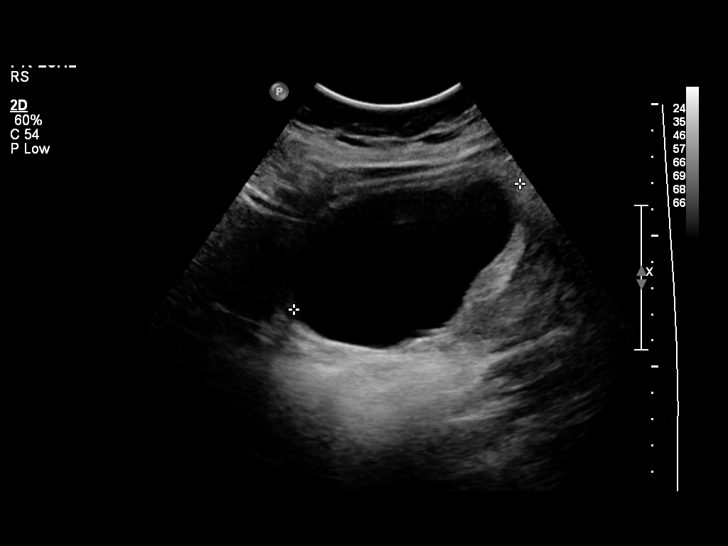
[im 3/21]
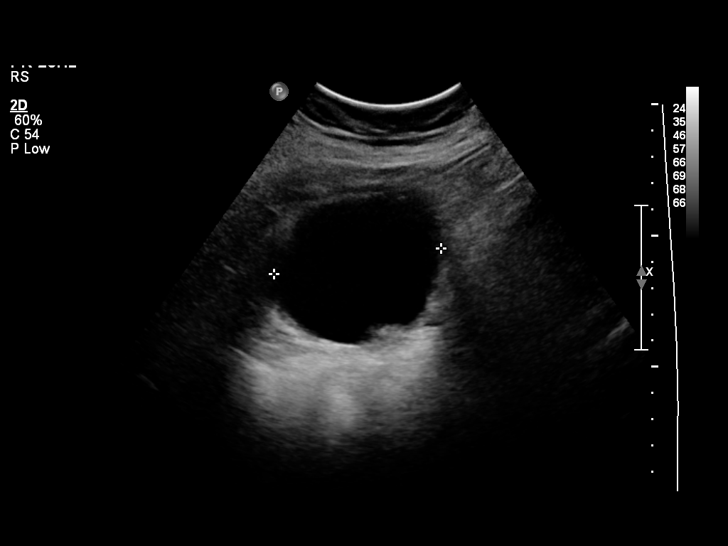
[im 5/21]
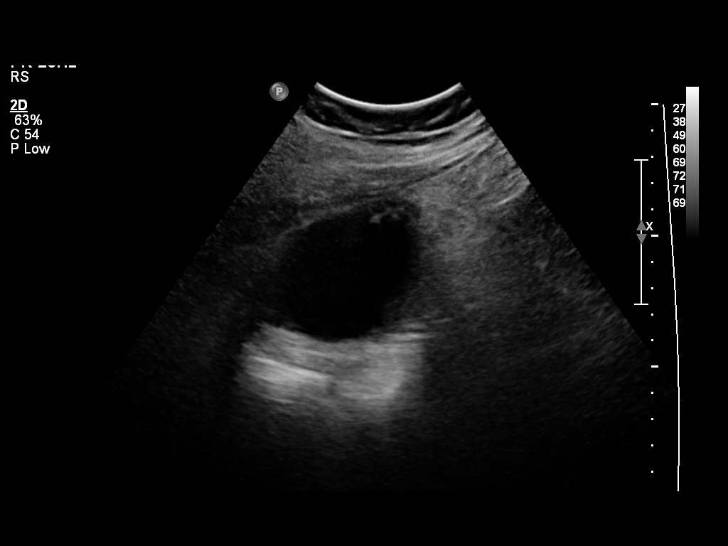
[im 7/21]
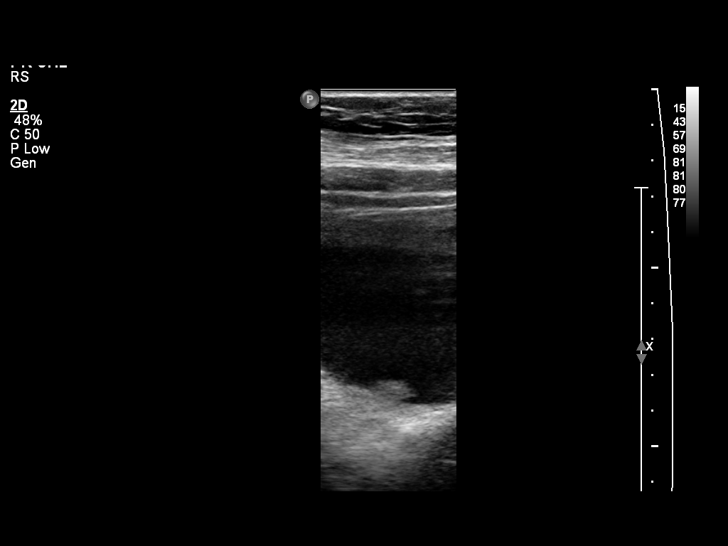
[im 9/21]
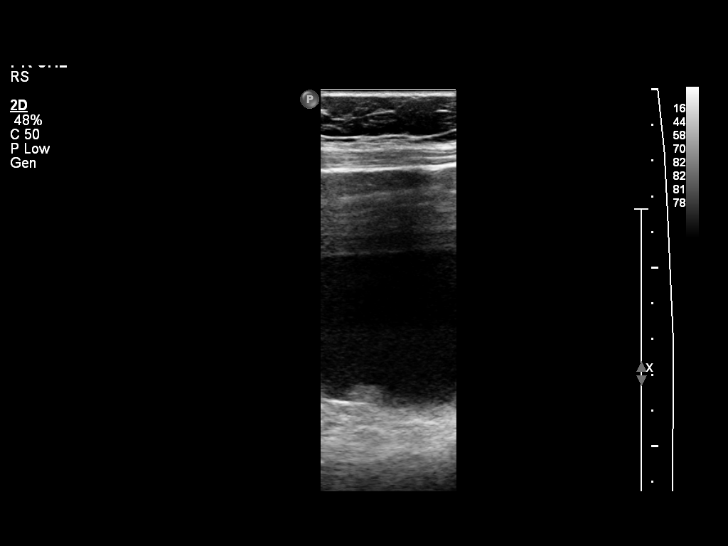
[im 11/21]
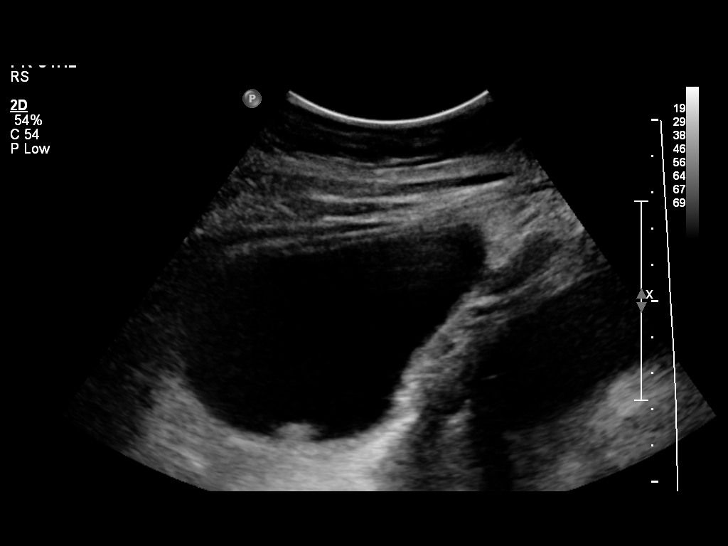
[im 13/21]
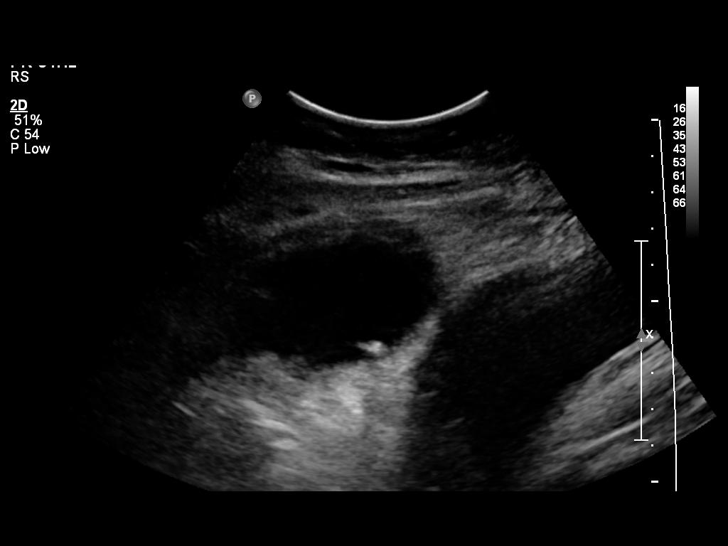
[im 15/21]
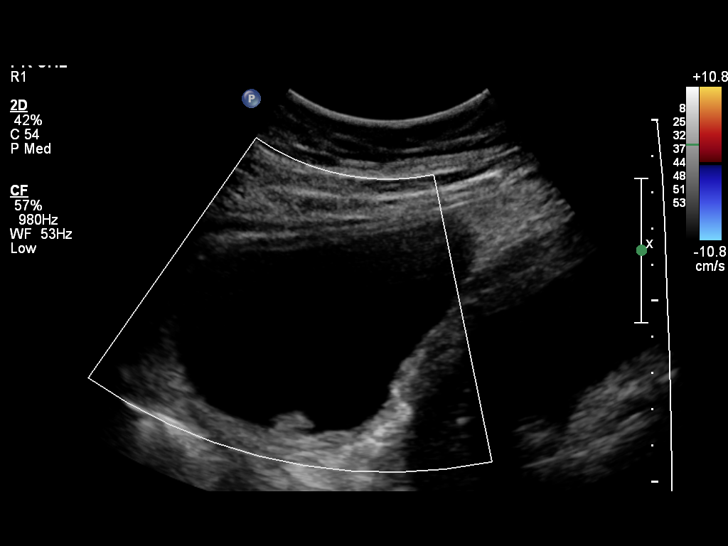
[im 17/21]
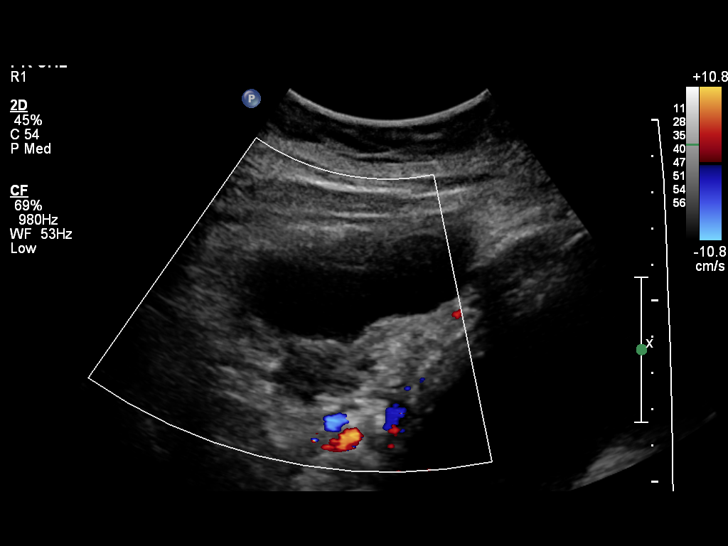
[im 19/21]
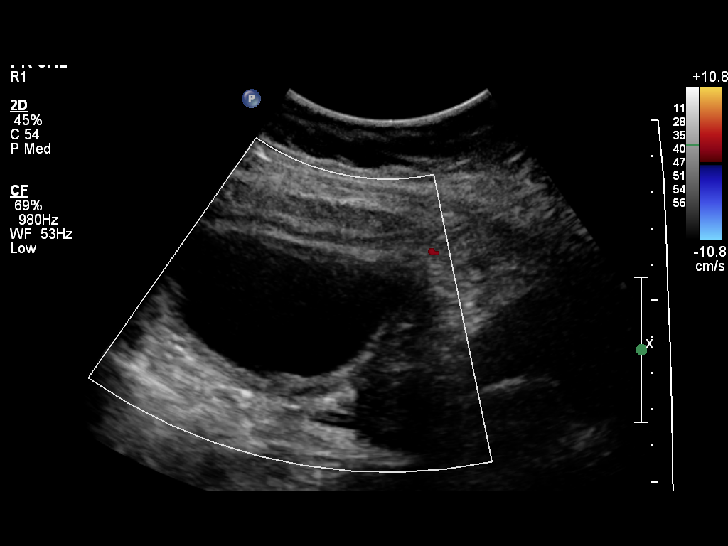
[im 21/21]
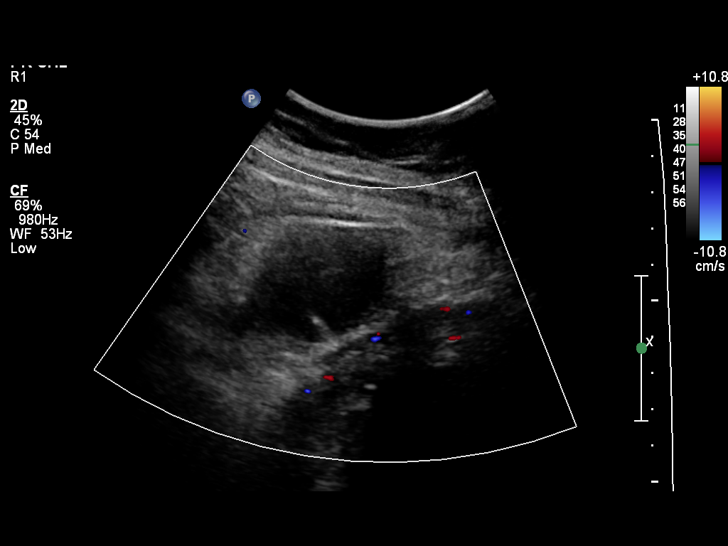

[13 of 25 positions shown; findings below may reference images not displayed]

FINDINGS: Right ovary

9.8 x 5.8 x 6.4 cm cystic ovarian mass with mural nodularity and
single thin incomplete septation. Thin rim of peripheral ovarian
tissue.

Left ovary

Not discretely visualized.

Pulsed Doppler evaluation demonstrates normal low-resistance
arterial and venous waveforms in the right ovary.
IMPRESSION: 9.8 cm complex cystic right ovarian mass with mural nodularity and a
single thin incomplete septation. Benign or malignant ovarian
neoplasm is not excluded. Consider follow-up pelvic MRI with/
without contrast in 6-12 weeks (after delivery).

No evidence of right ovarian torsion.

Left ovary is not discretely visualized.

## 2016-11-08 ENCOUNTER — Encounter (HOSPITAL_COMMUNITY): Payer: Self-pay | Admitting: *Deleted

## 2016-11-08 ENCOUNTER — Emergency Department (HOSPITAL_COMMUNITY)
Admission: EM | Admit: 2016-11-08 | Discharge: 2016-11-08 | Disposition: A | Discharge status: Home / Self Care | Payer: 59 | Attending: Emergency Medicine | Admitting: Emergency Medicine

## 2016-11-08 DIAGNOSIS — Z7984 Long term (current) use of oral hypoglycemic drugs: Secondary | ICD-10-CM | POA: Insufficient documentation

## 2016-11-08 DIAGNOSIS — E119 Type 2 diabetes mellitus without complications: Secondary | ICD-10-CM | POA: Insufficient documentation

## 2016-11-08 DIAGNOSIS — M25472 Effusion, left ankle: Secondary | ICD-10-CM

## 2016-11-08 DIAGNOSIS — R2242 Localized swelling, mass and lump, left lower limb: Secondary | ICD-10-CM | POA: Insufficient documentation

## 2016-11-08 HISTORY — DX: Type 2 diabetes mellitus without complications: E11.9

## 2016-11-08 LAB — HEPATIC FUNCTION PANEL
ALK PHOS: 63 U/L (ref 38–126)
ALT: 25 U/L (ref 14–54)
AST: 22 U/L (ref 15–41)
Albumin: 3.7 g/dL (ref 3.5–5.0)
BILIRUBIN INDIRECT: 0.3 mg/dL (ref 0.3–0.9)
BILIRUBIN TOTAL: 0.4 mg/dL (ref 0.3–1.2)
Bilirubin, Direct: 0.1 mg/dL (ref 0.1–0.5)
Total Protein: 7.1 g/dL (ref 6.5–8.1)

## 2016-11-08 LAB — URINALYSIS, ROUTINE W REFLEX MICROSCOPIC
BILIRUBIN URINE: NEGATIVE
Glucose, UA: NEGATIVE mg/dL
HGB URINE DIPSTICK: NEGATIVE
Ketones, ur: NEGATIVE mg/dL
LEUKOCYTES UA: NEGATIVE
Nitrite: NEGATIVE
PROTEIN: NEGATIVE mg/dL
Specific Gravity, Urine: 1.021 (ref 1.005–1.030)
pH: 5 (ref 5.0–8.0)

## 2016-11-08 LAB — BASIC METABOLIC PANEL
Anion gap: 10 (ref 5–15)
BUN: 8 mg/dL (ref 6–20)
CHLORIDE: 105 mmol/L (ref 101–111)
CO2: 21 mmol/L — AB (ref 22–32)
Calcium: 8.7 mg/dL — ABNORMAL LOW (ref 8.9–10.3)
Creatinine, Ser: 0.73 mg/dL (ref 0.44–1.00)
GFR calc Af Amer: >60 mL/min (ref >60)
Glucose, Bld: 95 mg/dL (ref 65–99)
Potassium: 3.7 mmol/L (ref 3.5–5.1)
Sodium: 136 mmol/L (ref 135–145)

## 2016-11-08 LAB — BRAIN NATRIURETIC PEPTIDE: B Natriuretic Peptide: 51.3 pg/mL (ref 0.0–100.0)

## 2016-11-08 LAB — PREGNANCY, URINE: Preg Test, Ur: NEGATIVE

## 2016-11-08 NOTE — ED Notes (Signed)
Patient verbalized understanding of discharge instructions and denies any further needs or questions at this time. VS stable. Patient ambulatory with steady gait - declined wheelchair and requested to walk out - escorted patient to ED entrance.

## 2016-11-08 NOTE — ED Notes (Signed)
ED Provider at bedside. 

## 2016-11-08 NOTE — ED Provider Notes (Signed)
[facility] DEPT Provider Note   CSN: [id number] Arrival date & time: 11/08/16  1445     History   Chief Complaint Chief Complaint  Patient presents with  . Leg Pain  . Leg Swelling    HPI Sandy Mata is a 31 y.o. female.  31yo F w/ PMH including IBS, NIDDM who p/w L ankle swelling. Pt states that for a long time she has intermittently had R ankle swelling which usually resolves with rest and elevation. For the past 3 days, she has noticed swelling involving only her left ankle with mild left lateral ankle pain. No trauma, strain, or heavy physical activity. She spoke with PCP who told her to come to ED for evaluation. No associated calf/thigh/leg pain or swelling, no numbness. She denies any fever, recent illness, chest pain, SOB, recent travel, OCP use, h/o cancer, or h/o blood clots. No medications PTA. No hx of kidney disease, liver disease, or heart problems.   The history is provided by the patient.  Leg Pain      Past Medical History:  Diagnosis Date  . Depression    quit meds with pregnancy  . Dermoid cyst of ovary   . Diabetes mellitus without complication ([place])   . Diarrhea   . History of chlamydia   . IBS (irritable bowel syndrome)   . Medical history non-contributory     Patient Active Problem List   Diagnosis Date Noted  . Active labor 12/16/2013  . Supervision of other normal pregnancy 12/16/2013  . Abdominal pain affecting pregnancy, antepartum 12/15/2013  . Abdominal pain affecting pregnancy   . Non-reactive NST (non-stress test)   . [redacted] weeks gestation of pregnancy     Past Surgical History:  Procedure Laterality Date  . CHOLECYSTECTOMY    . ROBOTIC ASSISTED LAPAROSCOPIC OVARIAN CYSTECTOMY Right 01/21/2014   Procedure: ROBOTIC ASSISTED LAPAROSCOPIC OVARIAN CYSTECTOMY;  Surgeon: Daria Pastures, MD;  Location: [facility];  Service: Gynecology;  Laterality: Right;  . TONSILLECTOMY    . UNILATERAL SALPINGECTOMY Right 01/21/2014   Procedure:  UNILATERAL SALPINGECTOMY;  Surgeon: Daria Pastures, MD;  Location: [facility];  Service: Gynecology;  Laterality: Right;    OB History    Gravida Para Term Preterm AB Living   2 2 1 1  0 2   SAB TAB Ectopic Multiple Live Births   0 0 0 0 2       Home Medications    Prior to Admission medications   Medication Sig Start Date End Date Taking? Authorizing Provider  Levonorgestrel (MIRENA IU) by Intrauterine route.   Yes [provider]  metFORMIN (GLUCOPHAGE) 500 MG tablet Take 500 mg by mouth daily with breakfast.   Yes [provider]  dicyclomine (BENTYL) 10 MG capsule Take 1 capsule (10 mg total) by mouth 2 (two) times daily before a meal. Patient not taking: Reported on 11/08/2016 04/09/14   Butch Penny, NP    Family History Family History  Problem Relation Age of Onset  . Heart disease Father   . Hypertension Father   . Diabetes Father   . Crohn's disease Brother   . Diabetes Paternal Grandmother     Social History Social History  Substance Use Topics  . Smoking status: Never Smoker  . Smokeless tobacco: Never Used  . Alcohol use No     Allergies   Patient has no known allergies.   Review of Systems Review of Systems All other systems reviewed and are negative except that which  was mentioned in HPI   Physical Exam Updated Vital Signs BP 135/83   Pulse 84   Temp 98.5 F (36.9 C) (Oral)   Resp 16   SpO2 99%   Physical Exam  Constitutional: She is oriented to person, place, and time. She appears well-developed and well-nourished. No distress.  HENT:  Head: Normocephalic and atraumatic.  Eyes: Conjunctivae are normal.  Neck: Neck supple.  Cardiovascular: Normal rate, regular rhythm, normal heart sounds and intact distal pulses.   No murmur heard. Pulmonary/Chest: Effort normal and breath sounds normal.  Abdominal: Soft. Bowel sounds are normal. She exhibits no distension. There is no tenderness.  Musculoskeletal: She exhibits no  edema or deformity.  L ankle: very mild edema compared to right extending onto dorsal L foot; mild lateral malleolus tenderness and along ATFL; no joint laxity, warmth, or redness; no midfoot pain or instability; no calf or thigh tenderness, no swelling compared to left leg  Neurological: She is alert and oriented to person, place, and time. No sensory deficit.  Fluent speech  Skin: Skin is warm and dry.  Psychiatric: She has a normal mood and affect. Judgment normal.  Nursing note and vitals reviewed.    ED Treatments / Results  Labs (all labs ordered are listed, but only abnormal results are displayed) Labs Reviewed  BASIC METABOLIC PANEL - Abnormal; Notable for the following:       Result Value   CO2 21 (*)    Calcium 8.7 (*)    All other components within normal limits  HEPATIC FUNCTION PANEL  URINALYSIS, ROUTINE W REFLEX MICROSCOPIC  BRAIN NATRIURETIC PEPTIDE  PREGNANCY, URINE  POC URINE PREG, ED    EKG  EKG Interpretation None       Radiology No results found.  Procedures Procedures (including critical care time)  Medications Ordered in ED Medications - No data to display   Initial Impression / Assessment and Plan / ED Course  I have reviewed the triage vital signs and the nursing notes.  Pertinent labs results that were available during my care of the patient were reviewed by me and considered in my medical decision making (see chart for details).     PT w/ several days of atraumatic L ankle swelling. No associated calf/leg/thigh pain, chest pain, SOB, infectious sx, or other complaints. Her mild swelling seemed localized just to the ankle and not extending onto the leg. She denied any signs or symptoms to suggest DVT particularly because the pain was localized over the joint and she seemed to have tenderness along the ATFL. Her labs show no evidence of kidney problems, liver disease, her elevated BNP to suggest acute life-threatening process.I have discussed  supportive measures including elevation, ice, NSAIDs course, and compression. Instructed to follow-up with PCP if her symptoms do not improve after one week. Extensively reviewed return precautions including signs of infection, leg swelling or pain suggestive of clot, or any other sudden development of symptoms. Patient voiced understanding and was discharged in satisfactory condition.  Final Clinical Impressions(s) / ED Diagnoses   Final diagnoses:  Left ankle swelling    New Prescriptions Discharge Medication List as of 11/08/2016 10:24 PM       Cherryl Babin, Wenda Overland, MD 11/08/16 2335

## 2016-11-08 NOTE — ED Notes (Addendum)
Patient ambulatory to restroom and back with steady gait. Reports tenderness with ambulation in posterior LEFT ankle, but no difficulties in walking.

## 2016-11-08 NOTE — ED Triage Notes (Signed)
To ED for eval leg swelling. Pt is newly dx DM within the past 6 months. On Metformin. States her legs always swell but are relieved with rest and elevation. Since Sunday swelling has become worse and she was told to come to ED by her PCP. No pain. No sob.

## 2019-11-17 ENCOUNTER — Ambulatory Visit
Admission: EM | Admit: 2019-11-17 | Discharge: 2019-11-17 | Disposition: A | Discharge status: Home / Self Care | Payer: BC Managed Care – PPO

## 2019-11-17 ENCOUNTER — Ambulatory Visit (INDEPENDENT_AMBULATORY_CARE_PROVIDER_SITE_OTHER): Payer: BC Managed Care – PPO

## 2019-11-17 ENCOUNTER — Other Ambulatory Visit: Payer: Self-pay

## 2019-11-17 DIAGNOSIS — S9032XA Contusion of left foot, initial encounter: Secondary | ICD-10-CM

## 2019-11-17 DIAGNOSIS — R9389 Abnormal findings on diagnostic imaging of other specified body structures: Secondary | ICD-10-CM

## 2019-11-17 DIAGNOSIS — M79672 Pain in left foot: Secondary | ICD-10-CM | POA: Diagnosis not present

## 2019-11-17 DIAGNOSIS — M25475 Effusion, left foot: Secondary | ICD-10-CM

## 2019-11-17 MED ORDER — NAPROXEN 500 MG PO TABS: 500.0000 mg | ORAL_TABLET | Freq: Two times a day (BID) | ORAL | 0 refills | Status: DC

## 2019-11-17 MED ORDER — TRAMADOL HCL 50 MG PO TABS: 50.0000 mg | ORAL_TABLET | Freq: Two times a day (BID) | ORAL | 0 refills | Status: DC | PRN

## 2019-11-17 NOTE — ED Triage Notes (Signed)
Pt presents with left foot injury from mat slipping underneath feet, pt unable to bear weight, swelling noted to foot

## 2019-11-17 NOTE — Discharge Instructions (Signed)
X-ray with soft tissue swelling suggesting hematoma, per radiologist difficult to exclude nondisplaced fracture of fifth metatarsal (toe) Continue conservative management of rest, ice, and elevation Cam walker given in office Take naproxen as needed for pain relief (may cause abdominal discomfort, ulcers, and GI bleeds avoid taking with other NSAIDs) Tramadol for severe break-through pain.  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

## 2019-11-17 NOTE — ED Provider Notes (Signed)
[place]   [id number] 11/17/19 Arrival Time: 9485  CC: LT foot PAIN  SUBJECTIVE: History from: patient. Sandy Mata is a 34 y.o. female complains of LT foot pain and injury x 1.5 hour.  Symptoms began after slip and fall.  Twisted ankle/ foot.  Localizes the pain to the top of foot.  Describes the pain as intermittent and achy in character.  Has NOT tried OTC medications.  Symptoms are made worse with weight-bearing.  Denies similar symptoms in the past.  Complains of associated swelling and numbness/ tingling.  Denies fever, chills, erythema, ecchymosis, weakness.  ROS: As per HPI.  All other pertinent ROS negative.     Past Medical History:  Diagnosis Date   Depression    quit meds with pregnancy   Dermoid cyst of ovary    Diabetes mellitus without complication ([place])    Diarrhea    History of chlamydia    IBS (irritable bowel syndrome)    Medical history non-contributory    Past Surgical History:  Procedure Laterality Date   CHOLECYSTECTOMY     ROBOTIC ASSISTED LAPAROSCOPIC OVARIAN CYSTECTOMY Right 01/21/2014   Procedure: ROBOTIC ASSISTED LAPAROSCOPIC OVARIAN CYSTECTOMY;  Surgeon: Daria Pastures, MD;  Location: [facility];  Service: Gynecology;  Laterality: Right;   TONSILLECTOMY     UNILATERAL SALPINGECTOMY Right 01/21/2014   Procedure: UNILATERAL SALPINGECTOMY;  Surgeon: Daria Pastures, MD;  Location: [facility];  Service: Gynecology;  Laterality: Right;   No Known Allergies No current facility-administered medications on file prior to encounter.   Current Outpatient Medications on File Prior to Encounter  Medication Sig Dispense Refill   buPROPion (WELLBUTRIN XL) 150 MG 24 hr tablet Take 150 mg by mouth every morning.     dicyclomine (BENTYL) 10 MG capsule Take 1 capsule (10 mg total) by mouth 2 (two) times daily before a meal. (Patient not taking: Reported on 11/08/2016) 60 capsule 3   Levonorgestrel (MIRENA IU) by Intrauterine route.      metFORMIN (GLUCOPHAGE) 500 MG tablet Take 500 mg by mouth daily with breakfast.     Social History   Socioeconomic History   Marital status: Married    Spouse name: Not on file   Number of children: Not on file   Years of education: Not on file   Highest education level: Not on file  Occupational History   Not on file  Tobacco Use   Smoking status: Never Smoker   Smokeless tobacco: Never Used  Substance and Sexual Activity   Alcohol use: No   Drug use: No   Sexual activity: Yes    Birth control/protection: None  Other Topics Concern   Not on file  Social History Narrative   Not on file   Social Determinants of Health   Financial Resource Strain:    Difficulty of Paying Living Expenses: Not on file  Food Insecurity:    Worried About Running Out of Food in the Last Year: Not on file   [employer] of Food in the Last Year: Not on file  Transportation Needs:    Lack of Transportation (Medical): Not on file   Lack of Transportation (Non-Medical): Not on file  Physical Activity:    Days of Exercise per Week: Not on file   Minutes of Exercise per Session: Not on file  Stress:    Feeling of Stress : Not on file  Social Connections:    Frequency of Communication with Friends and Family: Not on file  Frequency of Social Gatherings with Friends and Family: Not on file   Attends Religious Services: Not on file   Active Member of Clubs or Organizations: Not on file   Attends Archivist Meetings: Not on file   Marital Status: Not on file  Intimate Partner Violence:    Fear of Current or Ex-Partner: Not on file   Emotionally Abused: Not on file   Physically Abused: Not on file   Sexually Abused: Not on file   Family History  Problem Relation Age of Onset   Heart disease Father    Hypertension Father    Diabetes Father    Crohn's disease Brother    Diabetes Paternal Grandmother     OBJECTIVE:  Vitals:   11/17/19 1512  BP: (!)  150/87  Pulse: (!) 102  Resp: 20  Temp: 98.8 F (37.1 C)  SpO2: 97%    General appearance: ALERT; in no acute distress.  Head: NCAT Lungs: Normal respiratory effort CV: Dorsalis pedis pulse 2+ Musculoskeletal: LT foot Inspection: Swelling localized to distal foot Palpation: TTP over distal 2nd-5th MTs ROM: LROM Strength: deferred Skin: warm and dry Neurologic: Sitting in wheelchair  Psychological: alert and cooperative; normal mood and affect  DIAGNOSTIC STUDIES:  DG Foot Complete Left  Result Date: 11/17/2019 CLINICAL DATA:  34 year old female with pain and swelling laterally after fall today. EXAM: LEFT FOOT - COMPLETE 3+ VIEW COMPARISON:  None. FINDINGS: Bulky soft tissue swelling and stranding at the distal left foot on the lateral view. No soft tissue gas. The phalanges appear intact and normally aligned. The 1st through 4th metatarsals appear intact. There is subtle cortical irregularity along the medial surface of the 5th metatarsal distal metadiaphysis, but no fracture plane identified. Tarsal bones appear intact and normally aligned. Grossly intact distal tibia and fibula. IMPRESSION: 1. Pronounced Soft tissue swelling suggesting hematoma. 2. No discrete fracture lucency but difficult to exclude a nondisplaced fracture of the 5th metatarsal distal metadiaphysis. Consider a repeat left foot series in 7-14 days. Electronically Signed   By: Genevie Ann M.D.   On: 11/17/2019 15:42    I have reviewed the x-rays myself and the radiologist interpretation. I am in agreement with the radiologist interpretation.      ASSESSMENT & PLAN:  1. Left foot pain   2. Hematoma of left foot   3. Abnormal x-ray      Meds ordered this encounter  Medications   naproxen (NAPROSYN) 500 MG tablet    Sig: Take 1 tablet (500 mg total) by mouth 2 (two) times daily.    Dispense:  30 tablet    Refill:  0    Order Specific Question:   Supervising Provider    Answer:   Raylene Everts [[id number]]    traMADol (ULTRAM) 50 MG tablet    Sig: Take 1 tablet (50 mg total) by mouth every 12 (twelve) hours as needed.    Dispense:  10 tablet    Refill:  0    Order Specific Question:   Supervising Provider    Answer:   Raylene Everts [[id number]]   X-ray with soft tissue swelling suggesting hematoma, per radiologist difficult to exclude nondisplaced fracture of fifth metatarsal (toe) Continue conservative management of rest, ice, and elevation Cam walker given in office Take naproxen as needed for pain relief (may cause abdominal discomfort, ulcers, and GI bleeds avoid taking with other NSAIDs) Tramadol for severe break-through pain.  DO NOT TAKE PRIOR TO DRIVING OR OPERATING  HEAVY MACHINERY Follow up with orthopedist or PCP for recheck (consider repeat x-rays in 7-14 days) Return or go to the ER if you have any new or worsening symptoms (fever, chills, chest pain, redness, swelling, bruising, worsening symptoms despite treatment etc...)   Reviewed expectations re: course of current medical issues. Questions answered. Outlined signs and symptoms indicating need for more acute intervention. Patient verbalized understanding. After Visit Summary given.    Lestine Box, PA-C 11/17/19 1557

## 2020-08-19 ENCOUNTER — Encounter: Payer: Self-pay | Admitting: Neurology

## 2020-08-19 ENCOUNTER — Ambulatory Visit: Payer: BC Managed Care – PPO | Admitting: Neurology

## 2020-08-19 ENCOUNTER — Other Ambulatory Visit: Payer: Self-pay

## 2020-08-19 VITALS — BP 118/76 | HR 77 | Ht 67.5 in | Wt 301.1 lb

## 2020-08-19 DIAGNOSIS — M79642 Pain in left hand: Secondary | ICD-10-CM | POA: Diagnosis not present

## 2020-08-19 DIAGNOSIS — M79641 Pain in right hand: Secondary | ICD-10-CM | POA: Diagnosis not present

## 2020-08-19 DIAGNOSIS — R252 Cramp and spasm: Secondary | ICD-10-CM | POA: Insufficient documentation

## 2020-08-19 NOTE — Patient Instructions (Signed)
Wear wrist splints for a few days and see if that makes a difference Magnesium OTC supplements 400-600mg  for a week Also a few bananas a day Tonic water with quinine Emg/ncs  Electromyoneurogram Electromyoneurogram is a test to check how well your muscles and nerves are working. This procedure includes the combined use of electromyogram (EMG) and nerve conduction study (NCS). EMG is used to look for muscular disorders. NCS, which is also called electroneurogram, measures how well your nerves arecontrolling your muscles. The procedures are usually done together to check if your muscles and nerves are healthy. If the results of the tests are abnormal, this may indicatedisease or injury, such as a neuromuscular disease or peripheral nerve damage. Tell a health care provider about: Any allergies you have. All medicines you are taking, including vitamins, herbs, eye drops, creams, and over-the-counter medicines. Any problems you or family members have had with anesthetic medicines. Any blood disorders you have. Any surgeries you have had. Any medical conditions you have. If you have a pacemaker. Whether you are pregnant or may be pregnant. What are the risks? Generally, this is a safe procedure. However, problems may occur, including: Infection where the electrodes were inserted. Bleeding. What happens before the procedure? Medicines Ask your health care provider about: Changing or stopping your regular medicines. This is especially important if you are taking diabetes medicines or blood thinners. Taking medicines such as aspirin and ibuprofen. These medicines can thin your blood. Do not take these medicines unless your health care provider tells you to take them. Taking over-the-counter medicines, vitamins, herbs, and supplements. General instructions Your health care provider may ask you to avoid: Beverages that have caffeine, such as coffee and tea. Any products that contain nicotine or  tobacco. These products include cigarettes, e-cigarettes, and chewing tobacco. If you need help quitting, ask your health care provider. Do not use lotions or creams on the same day that you will be having the procedure. What happens during the procedure? For EMG  Your health care provider will ask you to stay in a position so that he or she can access the muscle that will be studied. You may be standing, sitting, or lying down. You may be given a medicine that numbs the area (local anesthetic). A very thin needle that has an electrode will be inserted into your muscle. Another small electrode will be placed on your skin near the muscle. Your health care provider will ask you to continue to remain still. The electrodes will send a signal that tells about the electrical activity of your muscles. You may see this on a monitor or hear it in the room. After your muscles have been studied at rest, your health care provider will ask you to contract or flex your muscles. The electrodes will send a signal that tells about the electrical activity of your muscles. Your health care provider will remove the electrodes and the electrode needles when the procedure is finished. The procedure may vary among health care providers and hospitals. For NCS  An electrode that records your nerve activity (recording electrode) will be placed on your skin by the muscle that is being studied. An electrode that is used as a reference (reference electrode) will be placed near the recording electrode. A paste or gel will be applied to your skin between the recording electrode and the reference electrode. Your nerve will be stimulated with a mild shock. Your health care provider will measure how much time it takes for your muscle  to react. Your health care provider will remove the electrodes and the gel when the procedure is finished. The procedure may vary among health care providers and hospitals. What happens after the  procedure? It is up to you to get the results of your procedure. Ask your health care provider, or the department that is doing the procedure, when your results will be ready. Your health care provider may: Give you medicines for any pain. Monitor the insertion sites to make sure that bleeding stops. Summary Electromyoneurogram is a test to check how well your muscles and nerves are working. If the results of the tests are abnormal, this may indicate disease or injury. This is a safe procedure. However, problems may occur, such as bleeding and infection. Your health care provider will do two tests to complete this procedure. One checks your muscles (EMG) and another checks your nerves (NCS). It is up to you to get the results of your procedure. Ask your health care provider, or the department that is doing the procedure, when your results will be ready. This information is not intended to replace advice given to you by your health care provider. Make sure you discuss any questions you have with your healthcare provider. Document Revised: 10/09/2017 Document Reviewed: 09/21/2017 Elsevier Patient Education  2022 [place] Syndrome  Carpal tunnel syndrome is a condition that causes pain, numbness, and weakness in your hand and fingers. The carpal tunnel is a narrow area located on the palm side of your wrist. Repeated wrist motion or certain diseases may cause swelling within the tunnel. This swelling pinches the main nerve in the wrist.The main nerve in the wrist is called the median nerve. What are the causes? This condition may be caused by: Repeated and forceful wrist and hand motions. Wrist injuries. Arthritis. A cyst or tumor in the carpal tunnel. Fluid buildup during pregnancy. Use of tools that vibrate. Sometimes the cause of this condition is not known. What increases the risk? The following factors may make you more likely to develop this condition: Having a job  that requires you to repeatedly or forcefully move your wrist or hand or requires you to use tools that vibrate. This may include jobs that involve using computers, working on an [employer], or working with [place] such as Pension scheme manager. Being a woman. Having certain conditions, such as: Diabetes. Obesity. An underactive thyroid (hypothyroidism). Kidney failure. Rheumatoid arthritis. What are the signs or symptoms? Symptoms of this condition include: A tingling feeling in your fingers, especially in your thumb, index, and middle fingers. Tingling or numbness in your hand. An aching feeling in your entire arm, especially when your wrist and elbow are bent for a long time. Wrist pain that goes up your arm to your shoulder. Pain that goes down into your palm or fingers. A weak feeling in your hands. You may have trouble grabbing and holding items. Your symptoms may feel worse during the night. How is this diagnosed? This condition is diagnosed with a medical history and physical exam. You may also have tests, including: Electromyogram (EMG). This test measures electrical signals sent by your nerves into the muscles. Nerve conduction study. This test measures how well electrical signals pass through your nerves. Imaging tests, such as X-rays, ultrasound, and MRI. These tests check for possible causes of your condition. How is this treated? This condition may be treated with: Lifestyle changes. It is important to stop or change the activity that caused your condition.  Doing exercise and activities to strengthen and stretch your muscles and tendons (physical therapy). Making lifestyle changes to help with your condition and learning how to do your daily activities safely (occupational therapy). Medicines for pain and inflammation. This may include medicine that is injected into your wrist. A wrist splint or brace. Surgery. Follow these instructions at home: If you have a splint or  brace: Wear the splint or brace as told by your health care provider. Remove it only as told by your health care provider. Loosen the splint or brace if your fingers tingle, become numb, or turn cold and blue. Keep the splint or brace clean. If the splint or brace is not waterproof: Do not let it get wet. Cover it with a watertight covering when you take a bath or shower. Managing pain, stiffness, and swelling If directed, put ice on the painful area. To do this: If you have a removeable splint or brace, remove it as told by your health care provider. Put ice in a plastic bag. Place a towel between your skin and the bag or between the splint or brace and the bag. Leave the ice on for 20 minutes, 2-3 times a day. Do not fall asleep with the cold pack on your skin. Remove the ice if your skin turns bright red. This is very important. If you cannot feel pain, heat, or cold, you have a greater risk of damage to the area. Move your fingers often to reduce stiffness and swelling. General instructions Take over-the-counter and prescription medicines only as told by your health care provider. Rest your wrist and hand from any activity that may be causing your pain. If your condition is work related, talk with your employer about changes that can be made, such as getting a wrist pad to use while typing. Do any exercises as told by your health care provider, physical therapist, or occupational therapist. Keep all follow-up visits. This is important. Contact a health care provider if: You have new symptoms. Your pain is not controlled with medicines. Your symptoms get worse. Get help right away if: You have severe numbness or tingling in your wrist or hand. Summary Carpal tunnel syndrome is a condition that causes pain, numbness, and weakness in your hand and fingers. It is usually caused by repeated wrist motions. Lifestyle changes and medicines are used to treat carpal tunnel syndrome. Surgery  may be recommended. Follow your health care provider's instructions about wearing a splint, resting from activity, keeping follow-up visits, and calling for help. This information is not intended to replace advice given to you by your health care provider. Make sure you discuss any questions you have with your healthcare provider. Document Revised: 06/05/2019 Document Reviewed: 06/05/2019 Elsevier Patient Education  [place].

## 2020-08-19 NOTE — Progress Notes (Signed)
[facility]    Provider:  Dr Jaynee Eagles Requesting Provider: Lanelle Bal, PA-C Primary Care Provider:  Rory Percy, MD  CC:  hand cramping  HPI:  Sandy Mata is a 35 y.o. female here as requested by Lanelle Bal, PA-C for hand pain. She has cramping in the fingers radiating to the ventral wrists, she may get numbness and tingling but more cramping than anything, anytime she is using them specifically when she is baking or stirring, or repetitve movements like crochet, not hard for her to grab and let of lets say a door handle, no hx of neuromuscular disorders in the family, she has neck pain more stiffness in the shoulders as opposed to radiculopathy, numbness/tingling rare and not prominent in the symptoms, no weakness, stopping and shaking them out will help, taking a break will help, can happen multiple times a day depending on tasks, doesn't take a long time for her hands to cramp up maybe even a few minutes. On going for years, slowly progressively worse, no problems in the upper arms, legs, torso, no rashes or changes in color. Can be moderately painful, no family history, her children have nothing like this. No cramping in the feet. More digits 1-3. No other focal neurologic deficits, associated symptoms, inciting events or modifiable factors.  Reviewed notes, labs and imaging from outside physicians, which showed: reviewed provider notes, No details in notes provided from pcp  Review of Systems: Patient complains of symptoms per HPI as well as the following symptoms cramps. Pertinent negatives and positives per HPI. All others negative.   Social History   Socioeconomic History   Marital status: Married    Spouse name: Not on file   Number of children: Not on file   Years of education: some college   Highest education level: Not on file  Occupational History   Not on file  Tobacco Use   Smoking status: Never   Smokeless tobacco: Never  Substance and Sexual  Activity   Alcohol use: Yes    Comment: 1-2 times per month   Drug use: No   Sexual activity: Yes    Birth control/protection: None  Other Topics Concern   Not on file  Social History Narrative   Right hand    Caffeine - 3-4 cups   Lives at home with husband, mom and 2 children    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    Social Determinants of Health   Financial Resource Strain: Not on file  Food Insecurity: Not on file  Transportation Needs: Not on file  Physical Activity: Not on file  Stress: Not on file  Social Connections: Not on file  Intimate Partner Violence: Not on file    Family History  Problem Relation Age of Onset   Diabetes Mother    Heart disease Father    Hypertension Father    Diabetes Father    Kidney failure Father    Crohn's disease Brother    Diabetes Paternal Grandmother    Neuromuscular disorder Neg Hx     Past Medical History:  Diagnosis Date   Depression    quit meds with pregnancy   Dermoid cyst of ovary    Diabetes mellitus without complication ([place])    Diarrhea    History of chlamydia    IBS (irritable bowel syndrome)    Medical history non-contributory     Patient Active Problem List   Diagnosis Date Noted   Muscle cramping 08/19/2020   Morbid obesity ([place]) 08/19/2020  Active labor 12/16/2013   Supervision of other normal pregnancy 12/16/2013   Abdominal pain affecting pregnancy, antepartum 12/15/2013   Abdominal pain affecting pregnancy    Non-reactive NST (non-stress test)    [redacted] weeks gestation of pregnancy     Past Surgical History:  Procedure Laterality Date   CHOLECYSTECTOMY     ROBOTIC ASSISTED LAPAROSCOPIC OVARIAN CYSTECTOMY Right 01/21/2014   Procedure: ROBOTIC ASSISTED LAPAROSCOPIC OVARIAN CYSTECTOMY;  Surgeon: Daria Pastures, MD;  Location: [facility];  Service: Gynecology;  Laterality: Right;   TONSILLECTOMY     UNILATERAL SALPINGECTOMY Right 01/21/2014   Procedure: UNILATERAL SALPINGECTOMY;  Surgeon: Daria Pastures, MD;   Location: [facility];  Service: Gynecology;  Laterality: Right;    Current Outpatient Medications  Medication Sig Dispense Refill   buPROPion (WELLBUTRIN XL) 150 MG 24 hr tablet Take 150 mg by mouth every morning.     citalopram (CELEXA) 10 MG tablet Take 10 mg by mouth daily.     Levonorgestrel (MIRENA IU) by Intrauterine route.     LORazepam (ATIVAN) 0.5 MG tablet lorazepam 0.5 mg tablet  TAKE 1 TABLET BY MOUTH EVERY DAY AS NEEDED FOR ANXIETY     naproxen (NAPROSYN) 500 MG tablet Take 1 tablet (500 mg total) by mouth 2 (two) times daily. 30 tablet 0   No current facility-administered medications for this visit.    Allergies as of 08/19/2020   (No Known Allergies)    Vitals: BP 118/76   Pulse 77   Ht 5' 7.5" (1.715 m)   Wt (!) 301 lb 2 oz (136.6 kg)   SpO2 98%   BMI 46.47 kg/m  Last Weight:  Wt Readings from Last 1 Encounters:  08/19/20 (!) 301 lb 2 oz (136.6 kg)   Last Height:   Ht Readings from Last 1 Encounters:  08/19/20 5' 7.5" (1.715 m)     Physical exam: Exam: Gen: NAD, conversant, well nourised, obese, well groomed                     CV: RRR, no MRG. No Carotid Bruits. No peripheral edema, warm, nontender Eyes: Conjunctivae clear without exudates or hemorrhage  Neuro: Detailed Neurologic Exam  Speech:    Speech is normal; fluent and spontaneous with normal comprehension.  Cognition:    The patient is oriented to person, place, and time;     recent and remote memory intact;     language fluent;     normal attention, concentration,     fund of knowledge Cranial Nerves:    The pupils are equal, round, and reactive to light. The fundi are normal and spontaneous venous pulsations are present. Visual fields are full to finger confrontation. Extraocular movements are intact. Trigeminal sensation is intact and the muscles of mastication are normal. The face is symmetric. The palate elevates in the midline. Hearing intact. Voice is normal. Shoulder shrug is normal.  The tongue has normal motion without fasciculations.   Coordination:    Normal finger to nose   Gait:    Heel-toe and tandem gait are normal.   Motor Observation:    No asymmetry, no atrophy, and no involuntary movements noted. Tone:    Normal muscle tone.    Posture:    Posture is normal. normal erect    Strength:    Strength is V/V in the upper and lower limbs.      Sensation: intact to LT     Reflex Exam:  DTR's: hypo AJs, trace-1+  patellars and uppers Toes:    The toes are downgoing bilaterally.   Clonus:    Clonus is absent.   Mcphalen's maneuver at the wrists negative, neg Tinels's sign at wrists  Assessment/Plan:  Patient with hand cramping. Neuro exam normal. May be CTS, will order emg/ncs and try some other things in the meantime.  Wear wrist splints for a few days and see if that makes a difference Magnesium OTC supplements 400-600mg  for a week and see if that makes a difference Also try a few bananas a day and see if there is any difference Tonic water with quinine may help with symptomatic relief if the above don't help Emg/ncs  Orders Placed This Encounter  Procedures   TSH   B12 and Folate Panel   Methylmalonic acid, serum   CK   Comprehensive metabolic panel   CBC with Differential/Platelets   Magnesium   ANA, IFA (with reflex)   NCV with EMG(electromyography)   No orders of the defined types were placed in this encounter.   Cc: Lanelle Bal, PA-C,  Rory Percy, MD  Sarina Ill, MD  [facility] [address] [place], [place] [address]  Phone [phone] Fax [phone]

## 2020-08-30 ENCOUNTER — Ambulatory Visit (INDEPENDENT_AMBULATORY_CARE_PROVIDER_SITE_OTHER): Payer: BC Managed Care – PPO | Admitting: Neurology

## 2020-08-30 ENCOUNTER — Ambulatory Visit: Payer: BC Managed Care – PPO | Admitting: Neurology

## 2020-08-30 DIAGNOSIS — R202 Paresthesia of skin: Secondary | ICD-10-CM

## 2020-08-30 DIAGNOSIS — M79641 Pain in right hand: Secondary | ICD-10-CM

## 2020-08-30 DIAGNOSIS — R200 Anesthesia of skin: Secondary | ICD-10-CM

## 2020-08-30 DIAGNOSIS — R252 Cramp and spasm: Secondary | ICD-10-CM

## 2020-08-30 DIAGNOSIS — M79642 Pain in left hand: Secondary | ICD-10-CM

## 2020-08-30 DIAGNOSIS — Z0289 Encounter for other administrative examinations: Secondary | ICD-10-CM

## 2020-08-30 LAB — TSH: TSH: 2.31 u[IU]/mL (ref 0.450–4.500)

## 2020-08-30 LAB — CBC WITH DIFFERENTIAL/PLATELET
Basophils Absolute: 0.1 10*3/uL (ref 0.0–0.2)
Basos: 1 %
EOS (ABSOLUTE): 0.1 10*3/uL (ref 0.0–0.4)
Eos: 1 %
Hematocrit: 41.9 % (ref 34.0–46.6)
Hemoglobin: 12.7 g/dL (ref 11.1–15.9)
Immature Grans (Abs): 0 10*3/uL (ref 0.0–0.1)
Immature Granulocytes: 0 %
Lymphocytes Absolute: 2.5 10*3/uL (ref 0.7–3.1)
Lymphs: 33 %
MCH: 26.9 pg (ref 26.6–33.0)
MCHC: 30.3 g/dL — ABNORMAL LOW (ref 31.5–35.7)
MCV: 89 fL (ref 79–97)
Monocytes Absolute: 0.3 10*3/uL (ref 0.1–0.9)
Monocytes: 4 %
Neutrophils Absolute: 4.7 10*3/uL (ref 1.4–7.0)
Neutrophils: 61 %
Platelets: 304 10*3/uL (ref 150–450)
RBC: 4.72 x10E6/uL (ref 3.77–5.28)
RDW: 13.7 % (ref 11.7–15.4)
WBC: 7.8 10*3/uL (ref 3.4–10.8)

## 2020-08-30 LAB — B12 AND FOLATE PANEL
Folate: 5.4 ng/mL (ref >3.0)
Vitamin B-12: 507 pg/mL (ref 232–1245)

## 2020-08-30 LAB — COMPREHENSIVE METABOLIC PANEL
ALT: 32 IU/L (ref 0–32)
AST: 20 IU/L (ref 0–40)
Albumin/Globulin Ratio: 1.4 (ref 1.2–2.2)
Albumin: 4.1 g/dL (ref 3.8–4.8)
Alkaline Phosphatase: 87 IU/L (ref 44–121)
BUN/Creatinine Ratio: 7 — ABNORMAL LOW (ref 9–23)
BUN: 6 mg/dL (ref 6–20)
Bilirubin Total: 0.3 mg/dL (ref 0.0–1.2)
CO2: 24 mmol/L (ref 20–29)
Calcium: 9.1 mg/dL (ref 8.7–10.2)
Chloride: 104 mmol/L (ref 96–106)
Creatinine, Ser: 0.87 mg/dL (ref 0.57–1.00)
Globulin, Total: 2.9 g/dL (ref 1.5–4.5)
Glucose: 98 mg/dL (ref 65–99)
Potassium: 4.3 mmol/L (ref 3.5–5.2)
Sodium: 141 mmol/L (ref 134–144)
Total Protein: 7 g/dL (ref 6.0–8.5)
eGFR: 88 mL/min/{1.73_m2} (ref >59)

## 2020-08-30 LAB — METHYLMALONIC ACID, SERUM: Methylmalonic Acid: 101 nmol/L (ref 0–378)

## 2020-08-30 LAB — CK: Total CK: 110 U/L (ref 32–182)

## 2020-08-30 LAB — MAGNESIUM: Magnesium: 2.2 mg/dL (ref 1.6–2.3)

## 2020-08-30 LAB — ANTINUCLEAR ANTIBODIES, IFA: ANA Titer 1: NEGATIVE

## 2020-08-31 NOTE — Procedures (Signed)
        Full Name: Sandy Mata Gender: Female MRN #: [id number] Date of Birth: January 10, 1986    Visit Date: 08/30/2020 13:12 Age: 35 Years Examining Physician: Sarina Ill, MD  Requesting Provider: Lanelle Bal, PA-C Primary Care Provider:  Rory Percy, MD    History: Numbness and tingling of right hand.   Summary: EMG/NCS performed on the right upper extremity. All nerves and muscles (as indicated in the following tables) were within normal limits.        Conclusion: This is a normal study of the right upper extremity. No electrophysiologic evidence for neuropathy, radiculopathy or muscle disease.   Sarina Ill M.D.  [facility] [address], [place], [place] [address] Tel: [phone] Fax: [phone]  Verbal informed consent was obtained from the patient, patient was informed of potential risk of procedure, including bruising, bleeding, hematoma formation, infection, muscle weakness, muscle pain, numbness, among others.        [place]    Nerve / Sites Muscle Latency Ref. Amplitude Ref. Rel Amp Segments Distance Velocity Ref. Area    ms ms mV mV %  cm m/s m/s mVms  R Median - APB     Wrist APB 3.4 ?4.4 8.4 ?4.0 100 Wrist - APB 7   31.3     Upper arm APB 7.2  8.4  99.9 Upper arm - Wrist 21 55 ?49 32.6  R Ulnar - ADM     Wrist ADM 2.5 ?3.3 10.9 ?6.0 100 Wrist - ADM 7   42.6     B.Elbow ADM 5.6  10.1  93.4 B.Elbow - Wrist 19 61 ?49 35.6     A.Elbow ADM 7.4  10.1  100 A.Elbow - B.Elbow 10 58 ?49 35.0         SNC    Nerve / Sites Rec. Site Peak Lat Ref.  Amp Ref. Segments Distance Peak Diff Ref.    ms ms V V  cm ms ms  R Median, Ulnar - Transcarpal comparison     Median Palm Wrist 1.9 ?2.2 44 ?35 Median Palm - Wrist 8       Ulnar Palm Wrist 1.9 ?2.2 25 ?12 Ulnar Palm - Wrist 8          Median Palm - Ulnar Palm  0.0 ?0.4  R Median - Orthodromic (Dig II, Mid palm)     Dig II Wrist 2.8 ?3.4 15 ?10 Dig II - Wrist 13    R Ulnar - Orthodromic,  (Dig V, Mid palm)     Dig V Wrist 2.5 ?3.1 9 ?5 Dig V - Wrist 71             F  Wave    Nerve F Lat Ref.   ms ms  R Ulnar - ADM 26.5 ?32.0       EMG Summary Table    Spontaneous MUAP Recruitment  Muscle IA Fib PSW Fasc Other Amp Dur. Poly Pattern  R. Cervical paraspinals (low) Normal None None None _______ Normal Normal Normal Normal  R. Deltoid Normal None None None _______ Normal Normal Normal Normal  R. Triceps brachii Normal None None None _______ Normal Normal Normal Normal  R. Pronator teres Normal None None None _______ Normal Normal Normal Normal  R. First dorsal interosseous Normal None None None _______ Normal Normal Normal Normal  R. Opponens pollicis Normal None None None _______ Normal Normal Normal Normal

## 2020-08-31 NOTE — Progress Notes (Signed)
        Full Name: Sandy Mata Gender: Female MRN #: [id number] Date of Birth: Apr 23, 1985    Visit Date: 08/30/2020 13:12 Age: 35 Years Examining Physician: Sarina Ill, MD  Requesting Provider: Lanelle Bal, PA-C Primary Care Provider:  Rory Percy, MD    History: Numbness and tingling of right hand.   Summary: EMG/NCS performed on the right upper extremity. All nerves and muscles (as indicated in the following tables) were within normal limits.        Conclusion: This is a normal study of the right upper extremity. No electrophysiologic evidence for neuropathy, radiculopathy or muscle disease.   Sarina Ill M.D.  [facility] [address] [place], [place] [address] Tel: [phone] Fax: [phone]  Verbal informed consent was obtained from the patient, patient was informed of potential risk of procedure, including bruising, bleeding, hematoma formation, infection, muscle weakness, muscle pain, numbness, among others.        [place]    Nerve / Sites Muscle Latency Ref. Amplitude Ref. Rel Amp Segments Distance Velocity Ref. Area    ms ms mV mV %  cm m/s m/s mVms  R Median - APB     Wrist APB 3.4 ?4.4 8.4 ?4.0 100 Wrist - APB 7   31.3     Upper arm APB 7.2  8.4  99.9 Upper arm - Wrist 21 55 ?49 32.6  R Ulnar - ADM     Wrist ADM 2.5 ?3.3 10.9 ?6.0 100 Wrist - ADM 7   42.6     B.Elbow ADM 5.6  10.1  93.4 B.Elbow - Wrist 19 61 ?49 35.6     A.Elbow ADM 7.4  10.1  100 A.Elbow - B.Elbow 10 58 ?49 35.0         SNC    Nerve / Sites Rec. Site Peak Lat Ref.  Amp Ref. Segments Distance Peak Diff Ref.    ms ms V V  cm ms ms  R Median, Ulnar - Transcarpal comparison     Median Palm Wrist 1.9 ?2.2 44 ?35 Median Palm - Wrist 8       Ulnar Palm Wrist 1.9 ?2.2 25 ?12 Ulnar Palm - Wrist 8          Median Palm - Ulnar Palm  0.0 ?0.4  R Median - Orthodromic (Dig II, Mid palm)     Dig II Wrist 2.8 ?3.4 15 ?10 Dig II - Wrist 13    R Ulnar - Orthodromic,  (Dig V, Mid palm)     Dig V Wrist 2.5 ?3.1 9 ?5 Dig V - Wrist 15             F  Wave    Nerve F Lat Ref.   ms ms  R Ulnar - ADM 26.5 ?32.0       EMG Summary Table    Spontaneous MUAP Recruitment  Muscle IA Fib PSW Fasc Other Amp Dur. Poly Pattern  R. Cervical paraspinals (low) Normal None None None _______ Normal Normal Normal Normal  R. Deltoid Normal None None None _______ Normal Normal Normal Normal  R. Triceps brachii Normal None None None _______ Normal Normal Normal Normal  R. Pronator teres Normal None None None _______ Normal Normal Normal Normal  R. First dorsal interosseous Normal None None None _______ Normal Normal Normal Normal  R. Opponens pollicis Normal None None None _______ Normal Normal Normal Normal

## 2020-08-31 NOTE — Progress Notes (Signed)
See procedure note.

## 2021-05-21 IMAGING — DX DG FOOT COMPLETE 3+V*L*
3 series · 3 of 3 positions shown · non-contrast
Comparison: None.

CLINICAL DATA: 34-year-old female with pain and swelling laterally
after fall today.

EXAM:
LEFT FOOT - COMPLETE 3+ VIEW

[foot ap]
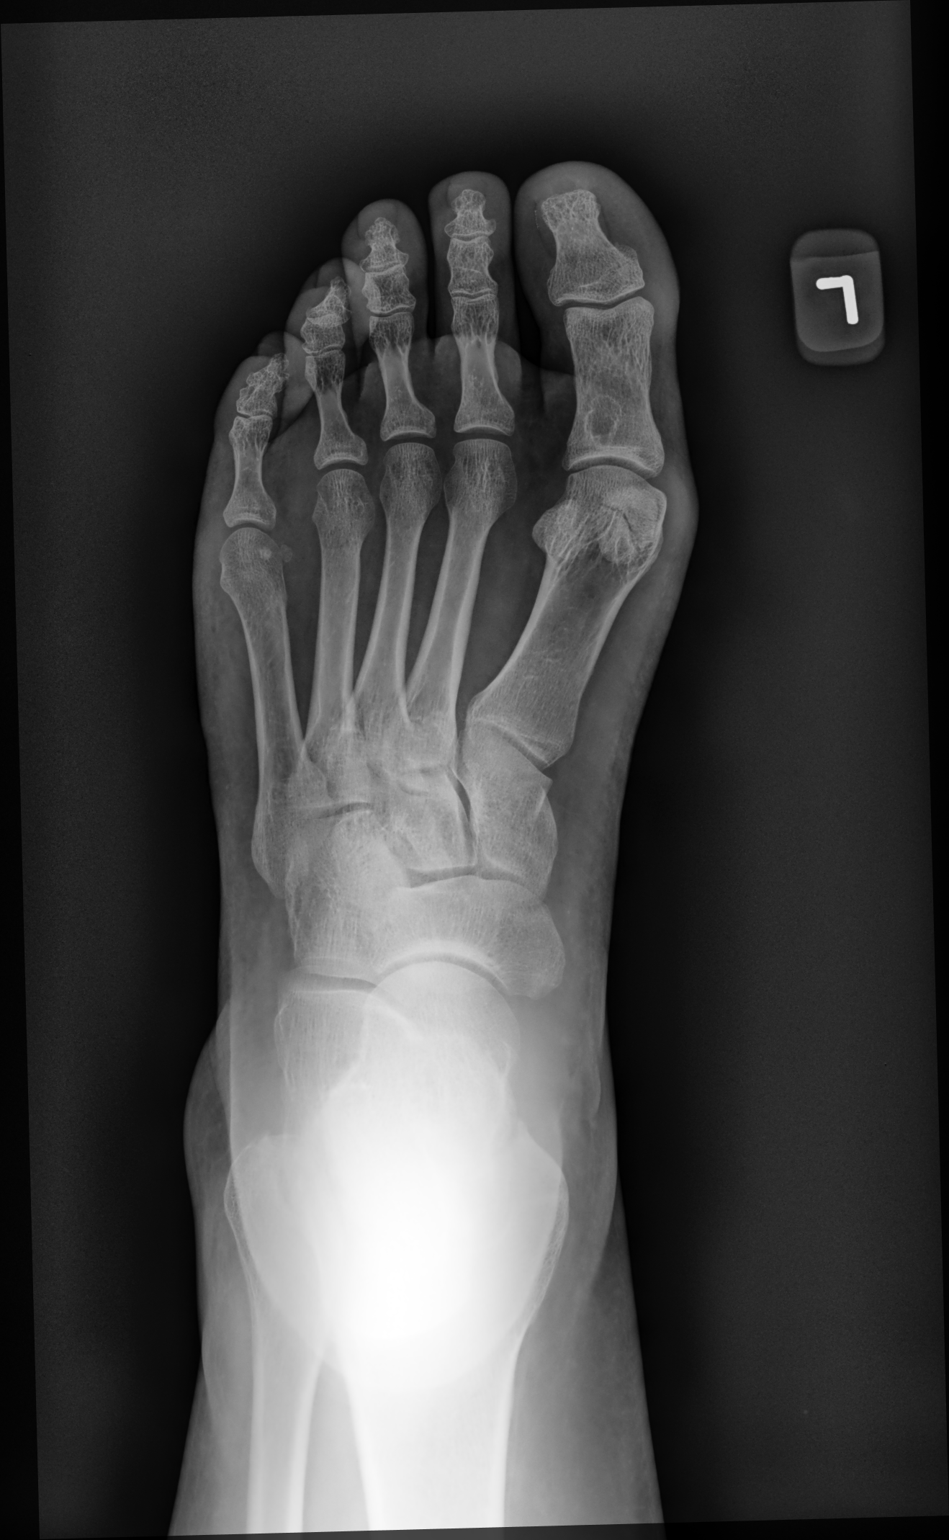

[foot mlo]
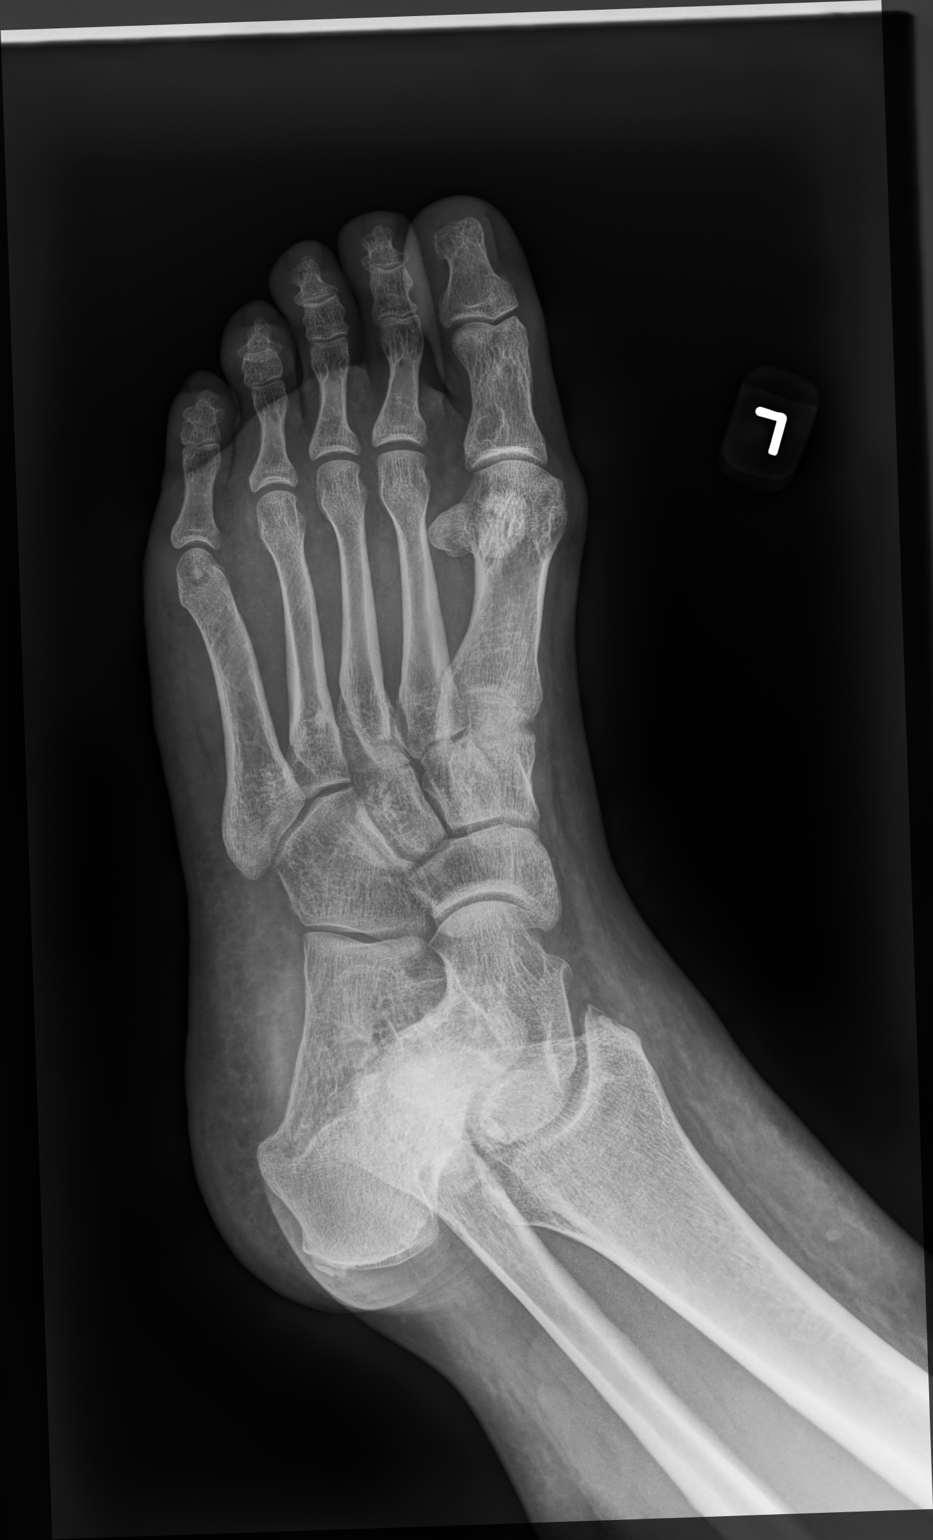

[foot lat]
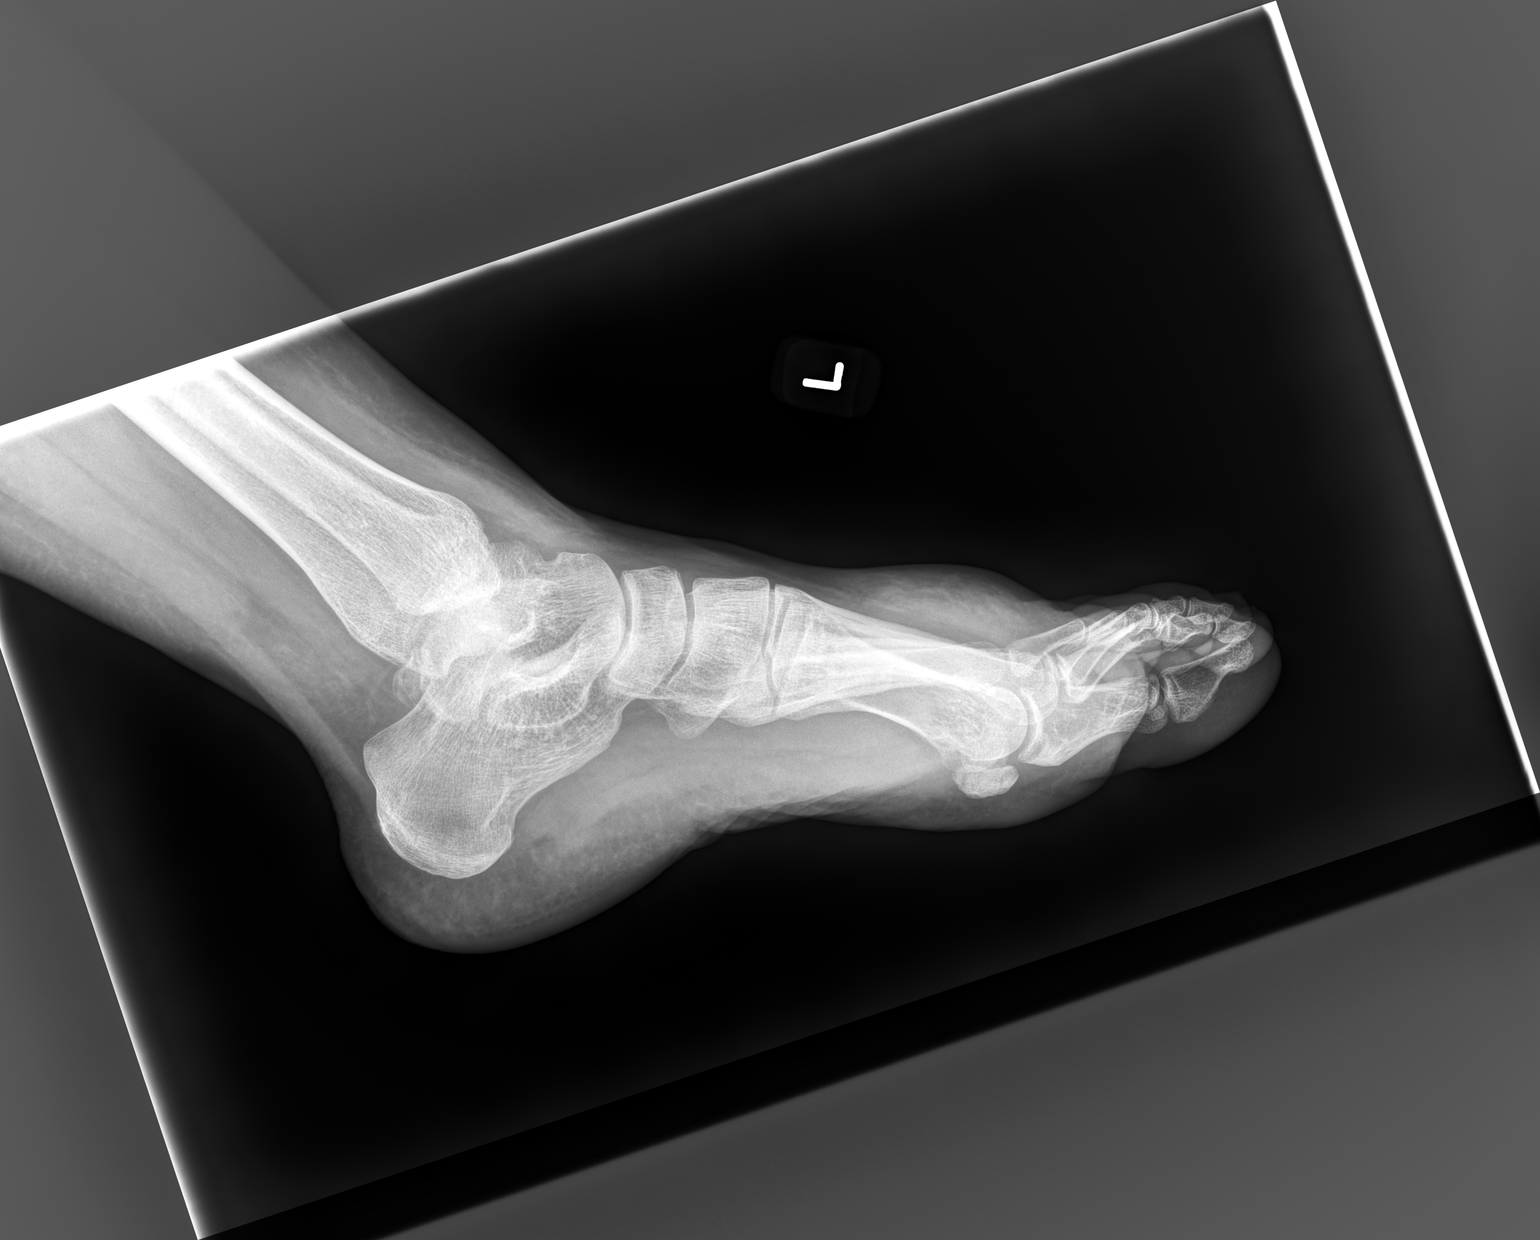

[3 of 3 positions shown; findings below may reference images not displayed]

FINDINGS: Bulky soft tissue swelling and stranding at the distal left foot on
the lateral view. No soft tissue gas.

The phalanges appear intact and normally aligned. The 1st through
4th metatarsals appear intact. There is subtle cortical irregularity
along the medial surface of the 5th metatarsal distal metadiaphysis,
but no fracture plane identified.

Tarsal bones appear intact and normally aligned. Grossly intact
distal tibia and fibula.
IMPRESSION: 1. Pronounced Soft tissue swelling suggesting hematoma.
2. No discrete fracture lucency but difficult to exclude a
nondisplaced fracture of the 5th metatarsal distal metadiaphysis.
Consider a repeat left foot series in 7-14 days.

## 2022-05-15 ENCOUNTER — Other Ambulatory Visit (HOSPITAL_COMMUNITY): Payer: Self-pay | Admitting: General Surgery

## 2022-05-19 ENCOUNTER — Encounter (HOSPITAL_COMMUNITY)
Admission: RE | Admit: 2022-05-19 | Discharge: 2022-05-19 | Disposition: A | Discharge status: Home / Self Care | Payer: BC Managed Care – PPO | Source: Ambulatory Visit | Attending: General Surgery | Admitting: General Surgery

## 2022-05-19 ENCOUNTER — Encounter: Payer: Self-pay | Admitting: Dietician

## 2022-05-19 ENCOUNTER — Encounter: Payer: BC Managed Care – PPO | Attending: General Surgery | Admitting: Dietician

## 2022-05-19 VITALS — Ht 67.5 in | Wt 313.1 lb

## 2022-05-19 DIAGNOSIS — Z01810 Encounter for preprocedural cardiovascular examination: Secondary | ICD-10-CM | POA: Diagnosis present

## 2022-05-19 DIAGNOSIS — E669 Obesity, unspecified: Secondary | ICD-10-CM | POA: Insufficient documentation

## 2022-05-19 DIAGNOSIS — Z01818 Encounter for other preprocedural examination: Secondary | ICD-10-CM

## 2022-05-19 NOTE — Progress Notes (Signed)
Nutrition Assessment for Bariatric Surgery: Pre-Surgery Behavioral and Nutrition Intervention Program   Medical Nutrition Therapy  Appt Start Time: 10:25    End Time: 11:20  Patient was seen on 05/19/2022 for Pre-Operative Nutrition Assessment. Purpose of todays visit  enhance perioperative outcomes along with a healthy weight maintenance   Referral stated Supervised Weight Loss (SWL) visits needed: 0  Pt completed visits.   Pt has cleared nutrition requirements.   Planned surgery: undecided/undetermined Pt expectation of surgery: To be around 180-200 lbs   NUTRITION ASSESSMENT   Anthropometrics  Start weight at [facility]: 313.1 lbs (date: 05/19/2022)  Height: 67.5 in BMI: 48.31 kg/m2     Clinical   Allergic to: Saxxenda, Trulicity  Medical hx: obesity, T2DM Medications: Wellbutrin, citalopram  Labs: no recent labs in EMR Notable signs/symptoms: none noted Any previous deficiencies? No  Evaluation of Nutritional Deficiencies: Micronutrient Nutrition Focused Physical Exam: Hair: No issues observed Eyes: No issues observed Mouth: No issues observed Neck: No issues observed Nails: No issues observed Skin: No issues observed  Lifestyle & Dietary Hx  Pt states she is not on any medication for diabetes.  Current Physical Activity Recommendations state 150 minutes per week of moderate to vigorous movement including Cardio and 1-2 days of resistance activities as well as flexibility/balance activities:  Pts current physical activity: ADLs, with 0% recommendation reached   Sleep Hygiene: duration and quality: okay, stating she wakes to use the bathroom, 8-9 hours a night  Current Patient Perceived Stress Level as stated by pt on a scale of 1-10:  2       Stress Management Techniques: breathe   According to the Dietary Guidelines for Americans Recommendation: equivalent 1.5-2 cups fruits per day, equivalent 2-3 cups vegetables per day and at least half all grains whole  Fruit  servings per day (on average): 1.5-2, meeting 100% recommendation  Non-starchy vegetable servings per day (on average): 1, meeting 33% recommendation  Whole Grains per day (on average): 0-1  Number of meals missed/skipped per week out of 21: 7  24-Hr Dietary Recall First Meal: coffee, skip Snack: nutra-grain bar Second Meal: sandwich or rice crisps Snack: starbucks coffee Third Meal: chicken or hamburger or steak, vegetable, macaroni Snack:  Beverages: coffee, soda, water with flavorings, plain water sometimes  Alcoholic beverages per week: 0   Estimated Energy Needs Calories: 1500   NUTRITION DIAGNOSIS  Overweight/obesity ([place]-3.3) related to past poor dietary habits and physical inactivity as evidenced by patient w/ planned (undetermined/undecided) surgery following dietary guidelines for continued weight loss.    NUTRITION INTERVENTION  Nutrition counseling (C-1) and education (E-2) to facilitate bariatric surgery goals.  Educated pt on micronutrient deficiencies post-surgery and behavioral/dietary strategies to start in order to mitigate that risk   Behavioral and Dietary Interventions Pre-Op Goals Reviewed with the Patient Nutrition: Healthy Eating Behaviors Switch to non-caloric, non-carbonated and non-caffeinated beverages such as  water, unsweetened tea, Crystal Light and zero calorie beverages (aim for 64 oz. per day) Cut out grazing between meals or at night  Find a protein shake you like Eat every 3-5 hours        Eliminate distractions while eating (TV, computer, reading, driving, texting) Take 59-93 minutes to eat a meal  Decrease high sugar foods/decrease high fat/fried foods Eliminate alcoholic beverages Increase protein intake (eggs, fish, chicken, yogurt) before surgery Eat non starchy vegetables 2 times a day 7 days a week Eat complex carbohydrates such as whole grains and fruits   Behavioral Modification: Physical Activity Increase  my usual daily  activity (use stairs, park farther, etc.) Engage in _______________________  activity  _______ minutes ______ times per week  Other:    _________________________________________________________________     Problem Solving I will think about my usual eating patterns and how to tweak them How can my friends and family support me Barriers to starting my changes Learn and understand appetite verses hunger   Healthy Coping Allow for ___________ activities per week to help me manage stress Reframe negative thoughts I will keep a picture of someone or something that is my inspiration & look at it daily   Monitoring  Weigh myself once a week  Measure my progress by monitoring how my clothes fit Keep a food record of what I eat and drink for the next ________ (time period) Take pictures of what I eat and drink for the next ________ (time period) Use an app to count steps/day for the next_______ (time period) Measure my progress such as increased energy and more restful sleep Monitor your acid reflux and bowel habits, are they getting better?   *Goals that are bolded indicate the pt would like to start working towards these  Handouts Provided Include  Bariatric Surgery handouts (Nutrition Visits, Pre Surgery Behavioral Change Goals, Protein Shakes Brands to Choose From, Vitamins & Mineral Supplementation)  Learning Style & Readiness for Change Teaching method utilized: Visual, Auditory, and hands on  Demonstrated degree of understanding via: Teach Back  Readiness Level: preparation Barriers to learning/adherence to lifestyle change: nothing identified  RD's Notes for Next Visit   MONITORING & EVALUATION Dietary intake, weekly physical activity, body weight, and preoperative behavioral change goals   Next Steps  Pt has completed visits. No further supervised visits required/recommended. Patient is to follow up at [facility] for post-op class > 2 week prior to scheduled surgery.

## 2022-05-31 ENCOUNTER — Ambulatory Visit (HOSPITAL_COMMUNITY)
Admission: RE | Admit: 2022-05-31 | Discharge: 2022-05-31 | Disposition: A | Discharge status: Home / Self Care | Payer: BC Managed Care – PPO | Source: Ambulatory Visit | Attending: General Surgery | Admitting: General Surgery

## 2022-06-26 ENCOUNTER — Encounter: Payer: Self-pay | Admitting: Skilled Nursing Facility1

## 2022-06-26 ENCOUNTER — Encounter: Payer: BC Managed Care – PPO | Attending: General Surgery | Admitting: Skilled Nursing Facility1

## 2022-06-26 NOTE — Progress Notes (Signed)
Pre-Operative Nutrition Class:    Patient was seen on 06/26/2022 for Pre-Operative Bariatric Surgery Education [facility]    Surgery date:  Surgery type: sleeve Start weight at [facility]: 313.1 Weight today: 314.7   The following the learning objectives were met by the patient during this course: Identify Pre-Op Dietary Goals and will begin 2 weeks pre-operatively Identify appropriate sources of fluids and proteins  State protein recommendations and appropriate sources pre and post-operatively Identify Post-Operative Dietary Goals and will follow for 2 weeks post-operatively Identify appropriate multivitamin and calcium sources Describe the need for physical activity post-operatively and will follow MD recommendations State when to call healthcare provider regarding medication questions or post-operative complications When having a diagnosis of diabetes understanding hypoglycemia symptoms and the inclusion of 1 complex carbohydrate per meal  Handouts given during class include: Pre-Op Bariatric Surgery Diet Handout Protein Shake Handout Post-Op Bariatric Surgery Nutrition Handout [facility] Program Information Flyer Support Group Information Flyer [facility] Bariatric Supplements Price List  Follow-Up Plan: Patient will follow-up at [facility] 2 weeks post operatively for diet advancement per MD.

## 2022-07-05 ENCOUNTER — Encounter (HOSPITAL_COMMUNITY): Payer: Self-pay

## 2022-07-06 ENCOUNTER — Ambulatory Visit: Payer: Self-pay | Admitting: General Surgery

## 2022-07-06 DIAGNOSIS — E119 Type 2 diabetes mellitus without complications: Secondary | ICD-10-CM

## 2022-07-19 NOTE — Patient Instructions (Addendum)
SURGICAL WAITING ROOM VISITATION  Patients having surgery or a procedure may have no more than 2 support people in the waiting area - these visitors may rotate.    Children under the age of 54 must have an adult with them who is not the patient.  Due to an increase in RSV and influenza rates and associated hospitalizations, children ages 42 and under may not visit patients in [facility] hospitals.  If the patient needs to stay at the hospital during part of their recovery, the visitor guidelines for inpatient rooms apply. Pre-op nurse will coordinate an appropriate time for 1 support person to accompany patient in pre-op.  This support person may not rotate.    Please refer to the [facility] website for the visitor guidelines for Inpatients (after your surgery is over and you are in a regular room).    Your procedure is scheduled on: 07/31/22   Report to [facility] Main Entrance    Report to admitting at 11:15 AM   Call this number if you have problems the morning of surgery [phone]   MORNING OF SURGERY DRINK:   DRINK 1 G2 drink BEFORE YOU LEAVE HOME, DRINK ALL OF THE  G2 DRINK AT ONE TIME.   NO SOLID FOOD AFTER 600 PM THE NIGHT BEFORE YOUR SURGERY. YOU MAY DRINK CLEAR FLUIDS. THE G2 DRINK YOU DRINK BEFORE YOU LEAVE HOME WILL BE THE LAST FLUIDS YOU DRINK BEFORE SURGERY.  PAIN IS EXPECTED AFTER SURGERY AND WILL NOT BE COMPLETELY ELIMINATED. AMBULATION AND TYLENOL WILL HELP REDUCE INCISIONAL AND GAS PAIN. MOVEMENT IS KEY!  YOU ARE EXPECTED TO BE OUT OF BED WITHIN 4 HOURS OF ADMISSION TO YOUR PATIENT ROOM.  SITTING IN THE RECLINER THROUGHOUT THE DAY IS IMPORTANT FOR DRINKING FLUIDS AND MOVING GAS THROUGHOUT THE GI TRACT.  COMPRESSION STOCKINGS SHOULD BE WORN [facility] STAY UNLESS YOU ARE WALKING.   INCENTIVE SPIROMETER SHOULD BE USED EVERY HOUR WHILE AWAKE TO DECREASE POST-OPERATIVE COMPLICATIONS SUCH AS PNEUMONIA.  WHEN DISCHARGED HOME, IT IS IMPORTANT TO  CONTINUE TO WALK EVERY HOUR AND USE THE INCENTIVE SPIROMETER EVERY HOUR.    You may have the following liquids until 10:30 AM DAY OF SURGERY  Water Non-Citrus Juices (without pulp, NO RED-Apple, White grape, White cranberry) Black Coffee (NO MILK/CREAM OR CREAMERS, sugar ok)  Clear Tea (NO MILK/CREAM OR CREAMERS, sugar ok) regular and decaf                             Plain Jell-O (NO RED)                                           Fruit ices (not with fruit pulp, NO RED)                                     Popsicles (NO RED)                                                               Sports drinks like Gatorade (NO  RED)               The day of surgery:  Drink ONE (1) Pre-Surgery G2 at 10:30 AM the morning of surgery. Drink in one sitting. Do not sip.  This drink was given to you during your hospital  pre-op appointment visit. Nothing else to drink after completing the  Pre-Surgery G2.          If you have questions, please contact your surgeon's office.   FOLLOW BOWEL PREP AND ANY ADDITIONAL PRE OP INSTRUCTIONS YOU RECEIVED FROM YOUR SURGEON'S OFFICE!!!     Oral Hygiene is also important to reduce your risk of infection.                                    Remember - BRUSH YOUR TEETH THE MORNING OF SURGERY WITH YOUR REGULAR TOOTHPASTE  DENTURES WILL BE REMOVED PRIOR TO SURGERY PLEASE DO NOT APPLY "Poly grip" OR ADHESIVES!!!   Take these medicines the morning of surgery with A SIP OF WATER: Bupropion, Citalopram, Lorazepam                              You may not have any metal on your body including hair pins, jewelry, and body piercing             Do not wear make-up, lotions, powders, perfumes, or deodorant  Do not wear nail polish including gel and S&S, artificial/acrylic nails, or any other type of covering on natural nails including finger and toenails. If you have artificial nails, gel coating, etc. that needs to be removed by a nail salon please have this removed prior  to surgery or surgery may need to be canceled/ delayed if the surgeon/ anesthesia feels like they are unable to be safely monitored.   Do not shave  48 hours prior to surgery.    Do not bring valuables to the hospital. [facility] IS NOT             RESPONSIBLE   FOR VALUABLES.   Contacts, glasses, dentures or bridgework may not be worn into surgery.   Bring small overnight bag day of surgery.   DO NOT BRING YOUR HOME MEDICATIONS TO THE HOSPITAL. PHARMACY WILL DISPENSE MEDICATIONS LISTED ON YOUR MEDICATION LIST TO YOU DURING YOUR ADMISSION IN THE HOSPITAL!   Special Instructions: Bring a copy of your healthcare power of attorney and living will documents the day of surgery if you haven't scanned them before.              Please read over the following fact sheets you were given: IF YOU HAVE QUESTIONS ABOUT YOUR PRE-OP INSTRUCTIONS PLEASE CALL [phone] [place]ras   If you received a COVID test during your pre-op visit  it is requested that you wear a mask when out in public, stay away from anyone that may not be feeling well and notify your surgeon if you develop symptoms. If you test positive for Covid or have been in contact with anyone that has tested positive in the last 10 days please notify you surgeon.    [place] - Preparing for Surgery Before surgery, you can play an important role.  Because skin is not sterile, your skin needs to be as free of germs as possible.  You can reduce the number of germs on your skin by  washing with CHG (chlorahexidine gluconate) soap before surgery.  CHG is an antiseptic cleaner which kills germs and bonds with the skin to continue killing germs even after washing. Please DO NOT use if you have an allergy to CHG or antibacterial soaps.  If your skin becomes reddened/irritated stop using the CHG and inform your nurse when you arrive at Short Stay. Do not shave (including legs and underarms) for at least 48 hours prior to the first CHG shower.  You may  shave your face/neck.  Please follow these instructions carefully:  1.  Shower with CHG Soap the night before surgery and the  morning of surgery.  2.  If you choose to wash your hair, wash your hair first as usual with your normal  shampoo.  3.  After you shampoo, rinse your hair and body thoroughly to remove the shampoo.                             4.  Use CHG as you would any other liquid soap.  You can apply chg directly to the skin and wash.  Gently with a scrungie or clean washcloth.  5.  Apply the CHG Soap to your body ONLY FROM THE NECK DOWN.   Do   not use on face/ open                           Wound or open sores. Avoid contact with eyes, ears mouth and   genitals (private parts).                       Wash face,  Genitals (private parts) with your normal soap.             6.  Wash thoroughly, paying special attention to the area where your    surgery  will be performed.  7.  Thoroughly rinse your body with warm water from the neck down.  8.  DO NOT shower/wash with your normal soap after using and rinsing off the CHG Soap.                9.  Pat yourself dry with a clean towel.            10.  Wear clean pajamas.            11.  Place clean sheets on your bed the night of your first shower and do not  sleep with pets. Day of Surgery : Do not apply any lotions/deodorants the morning of surgery.  Please wear clean clothes to the hospital/surgery center.  FAILURE TO FOLLOW THESE INSTRUCTIONS MAY RESULT IN THE CANCELLATION OF YOUR SURGERY  PATIENT SIGNATURE_________________________________  NURSE SIGNATURE__________________________________  ________________________________________________________________________

## 2022-07-19 NOTE — Progress Notes (Addendum)
COVID Vaccine Completed: yes  Date of COVID positive in last 90 days: no  PCP - Rosana Hoes, PA Cardiologist - n/a  Chest x-ray - 07/20/22 Epic EKG - 05/19/22 Epic Stress Test - n/a ECHO - n/a Cardiac Cath - n/a Pacemaker/ICD device last checked: n/a Spinal Cord Stimulator: n/a  Bowel Prep - No solids after 6pm  Sleep Study - n/a CPAP -   Fasting Blood Sugar - pt reports previously on Metformin years ago. Now in pre DM range and no longer taking. Checks Blood Sugar only when symptomatic Per Shanda Bumps, PA do not need A1C or CBG  Last dose of GLP1 agonist-  N/A GLP1 instructions:  N/A   Last dose of SGLT-2 inhibitors-  N/A SGLT-2 instructions: N/A   Blood Thinner Instructions:  n/a Aspirin Instructions: Last Dose:  Activity level: Can go up a flight of stairs and perform activities of daily living without stopping and without symptoms of chest pain or shortness of breath.   Anesthesia review:   Patient denies shortness of breath, fever, cough and chest pain at PAT appointment  Patient verbalized understanding of instructions that were given to them at the PAT appointment. Patient was also instructed that they will need to review over the PAT instructions again at home before surgery.

## 2022-07-20 ENCOUNTER — Encounter (HOSPITAL_COMMUNITY)
Admission: RE | Admit: 2022-07-20 | Discharge: 2022-07-20 | Disposition: A | Discharge status: Home / Self Care | Payer: BC Managed Care – PPO | Source: Ambulatory Visit | Attending: General Surgery | Admitting: General Surgery

## 2022-07-20 ENCOUNTER — Ambulatory Visit (HOSPITAL_COMMUNITY)
Admission: RE | Admit: 2022-07-20 | Discharge: 2022-07-20 | Disposition: A | Discharge status: Home / Self Care | Payer: BC Managed Care – PPO | Source: Ambulatory Visit | Attending: Anesthesiology | Admitting: Anesthesiology

## 2022-07-20 ENCOUNTER — Other Ambulatory Visit: Payer: Self-pay

## 2022-07-20 ENCOUNTER — Encounter (HOSPITAL_COMMUNITY): Payer: Self-pay

## 2022-07-20 VITALS — BP 146/91 | HR 81 | Temp 98.2°F | Resp 16 | Ht 67.5 in | Wt 311.0 lb

## 2022-07-20 DIAGNOSIS — E119 Type 2 diabetes mellitus without complications: Secondary | ICD-10-CM | POA: Diagnosis not present

## 2022-07-20 DIAGNOSIS — Z01818 Encounter for other preprocedural examination: Secondary | ICD-10-CM | POA: Diagnosis present

## 2022-07-20 HISTORY — DX: Other complications of anesthesia, initial encounter: T88.59XA

## 2022-07-20 LAB — COMPREHENSIVE METABOLIC PANEL
ALT: 18 U/L (ref 0–44)
AST: 19 U/L (ref 15–41)
Albumin: 3.8 g/dL (ref 3.5–5.0)
Alkaline Phosphatase: 58 U/L (ref 38–126)
Anion gap: 6 (ref 5–15)
BUN: 11 mg/dL (ref 6–20)
CO2: 26 mmol/L (ref 22–32)
Calcium: 9 mg/dL (ref 8.9–10.3)
Chloride: 105 mmol/L (ref 98–111)
Creatinine, Ser: 0.85 mg/dL (ref 0.44–1.00)
GFR, Estimated: >60 mL/min (ref >60)
Glucose, Bld: 123 mg/dL — ABNORMAL HIGH (ref 70–99)
Potassium: 4.4 mmol/L (ref 3.5–5.1)
Sodium: 137 mmol/L (ref 135–145)
Total Bilirubin: 0.7 mg/dL (ref 0.3–1.2)
Total Protein: 7.3 g/dL (ref 6.5–8.1)

## 2022-07-20 LAB — CBC WITH DIFFERENTIAL/PLATELET
Abs Immature Granulocytes: 0.04 10*3/uL (ref 0.00–0.07)
Basophils Absolute: 0.1 10*3/uL (ref 0.0–0.1)
Basophils Relative: 1 %
Eosinophils Absolute: 0.1 10*3/uL (ref 0.0–0.5)
Eosinophils Relative: 1 %
HCT: 39.8 % (ref 36.0–46.0)
Hemoglobin: 12.7 g/dL (ref 12.0–15.0)
Immature Granulocytes: 0 %
Lymphocytes Relative: 25 %
Lymphs Abs: 2.7 10*3/uL (ref 0.7–4.0)
MCH: 27.3 pg (ref 26.0–34.0)
MCHC: 31.9 g/dL (ref 30.0–36.0)
MCV: 85.4 fL (ref 80.0–100.0)
Monocytes Absolute: 0.5 10*3/uL (ref 0.1–1.0)
Monocytes Relative: 5 %
Neutro Abs: 7.3 10*3/uL (ref 1.7–7.7)
Neutrophils Relative %: 68 %
Platelets: 333 10*3/uL (ref 150–400)
RBC: 4.66 MIL/uL (ref 3.87–5.11)
RDW: 14.6 % (ref 11.5–15.5)
WBC: 10.7 10*3/uL — ABNORMAL HIGH (ref 4.0–10.5)
nRBC: 0 % (ref 0.0–0.2)

## 2022-07-20 LAB — TYPE AND SCREEN
ABO/RH(D): O POS
Antibody Screen: NEGATIVE

## 2022-07-31 ENCOUNTER — Other Ambulatory Visit: Payer: Self-pay

## 2022-07-31 ENCOUNTER — Inpatient Hospital Stay (HOSPITAL_COMMUNITY): Payer: BC Managed Care – PPO | Admitting: Physician Assistant

## 2022-07-31 ENCOUNTER — Inpatient Hospital Stay (HOSPITAL_COMMUNITY)
Admission: RE | Admit: 2022-07-31 | Discharge: 2022-08-01 | DRG: 621 | Disposition: A | Discharge status: Home / Self Care | Payer: BC Managed Care – PPO | Attending: General Surgery | Admitting: General Surgery

## 2022-07-31 ENCOUNTER — Inpatient Hospital Stay (HOSPITAL_COMMUNITY): Payer: BC Managed Care – PPO | Admitting: Anesthesiology

## 2022-07-31 ENCOUNTER — Encounter (HOSPITAL_COMMUNITY)
Admission: RE | Disposition: A | Discharge status: Home / Self Care | Payer: Self-pay | Source: Home / Self Care | Attending: General Surgery

## 2022-07-31 ENCOUNTER — Encounter (HOSPITAL_COMMUNITY): Payer: Self-pay | Admitting: General Surgery

## 2022-07-31 DIAGNOSIS — K5904 Chronic idiopathic constipation: Secondary | ICD-10-CM | POA: Diagnosis present

## 2022-07-31 DIAGNOSIS — Z809 Family history of malignant neoplasm, unspecified: Secondary | ICD-10-CM

## 2022-07-31 DIAGNOSIS — Z9049 Acquired absence of other specified parts of digestive tract: Secondary | ICD-10-CM | POA: Diagnosis not present

## 2022-07-31 DIAGNOSIS — Z8379 Family history of other diseases of the digestive system: Secondary | ICD-10-CM | POA: Diagnosis not present

## 2022-07-31 DIAGNOSIS — Z9884 Bariatric surgery status: Principal | ICD-10-CM

## 2022-07-31 DIAGNOSIS — F419 Anxiety disorder, unspecified: Secondary | ICD-10-CM | POA: Diagnosis present

## 2022-07-31 DIAGNOSIS — Z79899 Other long term (current) drug therapy: Secondary | ICD-10-CM

## 2022-07-31 DIAGNOSIS — Z86010 Personal history of colonic polyps: Secondary | ICD-10-CM | POA: Diagnosis not present

## 2022-07-31 DIAGNOSIS — R946 Abnormal results of thyroid function studies: Secondary | ICD-10-CM | POA: Diagnosis present

## 2022-07-31 DIAGNOSIS — E119 Type 2 diabetes mellitus without complications: Secondary | ICD-10-CM | POA: Diagnosis present

## 2022-07-31 DIAGNOSIS — Z7984 Long term (current) use of oral hypoglycemic drugs: Secondary | ICD-10-CM | POA: Diagnosis not present

## 2022-07-31 DIAGNOSIS — Z6841 Body Mass Index (BMI) 40.0 and over, adult: Secondary | ICD-10-CM | POA: Diagnosis not present

## 2022-07-31 HISTORY — PX: LAPAROSCOPIC GASTRIC SLEEVE RESECTION: SHX5895

## 2022-07-31 HISTORY — PX: UPPER GI ENDOSCOPY: SHX6162

## 2022-07-31 LAB — GLUCOSE, CAPILLARY
Glucose-Capillary: 122 mg/dL — ABNORMAL HIGH (ref 70–99)
Glucose-Capillary: 137 mg/dL — ABNORMAL HIGH (ref 70–99)
Glucose-Capillary: 140 mg/dL — ABNORMAL HIGH (ref 70–99)
Glucose-Capillary: 173 mg/dL — ABNORMAL HIGH (ref 70–99)
Glucose-Capillary: 97 mg/dL (ref 70–99)

## 2022-07-31 LAB — POCT PREGNANCY, URINE: Preg Test, Ur: NEGATIVE

## 2022-07-31 LAB — HEMOGLOBIN A1C
Hgb A1c MFr Bld: 5.5 % (ref 4.8–5.6)
Mean Plasma Glucose: 111.15 mg/dL

## 2022-07-31 LAB — HEMOGLOBIN AND HEMATOCRIT, BLOOD
HCT: 40.3 % (ref 36.0–46.0)
Hemoglobin: 13.2 g/dL (ref 12.0–15.0)

## 2022-07-31 SURGERY — GASTRECTOMY, SLEEVE, LAPAROSCOPIC
Anesthesia: General | Site: Abdomen

## 2022-07-31 MED ORDER — CHLORHEXIDINE GLUCONATE 4 % EX SOLN: Freq: Once | CUTANEOUS | Status: DC

## 2022-07-31 MED ORDER — AMISULPRIDE (ANTIEMETIC) 5 MG/2ML IV SOLN
INTRAVENOUS | Status: AC
  Filled 2022-07-31: qty 4

## 2022-07-31 MED ORDER — BUPIVACAINE LIPOSOME 1.3 % IJ SUSP
INTRAMUSCULAR | Status: AC
  Filled 2022-07-31: qty 20

## 2022-07-31 MED ORDER — AMISULPRIDE (ANTIEMETIC) 5 MG/2ML IV SOLN
10.0000 mg | Freq: Once | INTRAVENOUS | Status: AC | PRN
  Administered 2022-07-31: 10 mg via INTRAVENOUS

## 2022-07-31 MED ORDER — DEXMEDETOMIDINE HCL IN NACL 80 MCG/20ML IV SOLN
INTRAVENOUS | Status: DC | PRN
  Administered 2022-07-31: 8 ug via INTRAVENOUS

## 2022-07-31 MED ORDER — ENSURE MAX PROTEIN PO LIQD
2.0000 [oz_av] | ORAL | Status: DC
  Administered 2022-08-01 (×3): 2 [oz_av] via ORAL

## 2022-07-31 MED ORDER — ACETAMINOPHEN 500 MG PO TABS
1000.0000 mg | ORAL_TABLET | ORAL | Status: AC
  Administered 2022-07-31: 1000 mg via ORAL
  Filled 2022-07-31: qty 2

## 2022-07-31 MED ORDER — DEXAMETHASONE SODIUM PHOSPHATE 4 MG/ML IJ SOLN: 4.0000 mg | INTRAMUSCULAR | Status: DC

## 2022-07-31 MED ORDER — PROPOFOL 10 MG/ML IV BOLUS
INTRAVENOUS | Status: AC
  Filled 2022-07-31: qty 20

## 2022-07-31 MED ORDER — DEXAMETHASONE SODIUM PHOSPHATE 10 MG/ML IJ SOLN
INTRAMUSCULAR | Status: DC | PRN
  Administered 2022-07-31: 10 mg via INTRAVENOUS

## 2022-07-31 MED ORDER — SUGAMMADEX SODIUM 200 MG/2ML IV SOLN
INTRAVENOUS | Status: DC | PRN
  Administered 2022-07-31: 200 mg via INTRAVENOUS

## 2022-07-31 MED ORDER — ROCURONIUM BROMIDE 10 MG/ML (PF) SYRINGE
PREFILLED_SYRINGE | INTRAVENOUS | Status: DC | PRN
  Administered 2022-07-31: 60 mg via INTRAVENOUS

## 2022-07-31 MED ORDER — MIDAZOLAM HCL 2 MG/2ML IJ SOLN
INTRAMUSCULAR | Status: AC
  Filled 2022-07-31: qty 2

## 2022-07-31 MED ORDER — ONDANSETRON HCL 4 MG/2ML IJ SOLN
INTRAMUSCULAR | Status: DC | PRN
  Administered 2022-07-31: 4 mg via INTRAVENOUS

## 2022-07-31 MED ORDER — FENTANYL CITRATE (PF) 100 MCG/2ML IJ SOLN
INTRAMUSCULAR | Status: AC
  Filled 2022-07-31: qty 2

## 2022-07-31 MED ORDER — OXYCODONE HCL 5 MG/5ML PO SOLN
5.0000 mg | Freq: Four times a day (QID) | ORAL | Status: DC | PRN
  Administered 2022-07-31 – 2022-08-01 (×3): 5 mg via ORAL
  Filled 2022-07-31 (×3): qty 5

## 2022-07-31 MED ORDER — SODIUM CHLORIDE 0.9 % IV SOLN
2.0000 g | INTRAVENOUS | Status: AC
  Administered 2022-07-31: 2 g via INTRAVENOUS
  Filled 2022-07-31: qty 2

## 2022-07-31 MED ORDER — BUPIVACAINE LIPOSOME 1.3 % IJ SUSP: 20.0000 mL | Freq: Once | INTRAMUSCULAR | Status: DC

## 2022-07-31 MED ORDER — APREPITANT 40 MG PO CAPS
40.0000 mg | ORAL_CAPSULE | ORAL | Status: AC
  Administered 2022-07-31: 40 mg via ORAL
  Filled 2022-07-31: qty 1

## 2022-07-31 MED ORDER — ONDANSETRON HCL 4 MG/2ML IJ SOLN
INTRAMUSCULAR | Status: AC
  Filled 2022-07-31: qty 2

## 2022-07-31 MED ORDER — DEXAMETHASONE SODIUM PHOSPHATE 10 MG/ML IJ SOLN
INTRAMUSCULAR | Status: AC
  Filled 2022-07-31: qty 1

## 2022-07-31 MED ORDER — LIDOCAINE 2% (20 MG/ML) 5 ML SYRINGE
INTRAMUSCULAR | Status: DC | PRN
  Administered 2022-07-31: 60 mg via INTRAVENOUS

## 2022-07-31 MED ORDER — FENTANYL CITRATE PF 50 MCG/ML IJ SOSY
PREFILLED_SYRINGE | INTRAMUSCULAR | Status: AC
  Filled 2022-07-31: qty 1

## 2022-07-31 MED ORDER — INSULIN ASPART 100 UNIT/ML IJ SOLN
0.0000 [IU] | INTRAMUSCULAR | Status: DC
  Administered 2022-07-31 – 2022-08-01 (×4): 3 [IU] via SUBCUTANEOUS

## 2022-07-31 MED ORDER — SCOPOLAMINE 1 MG/3DAYS TD PT72
1.0000 | MEDICATED_PATCH | TRANSDERMAL | Status: DC
  Administered 2022-07-31: 1.5 mg via TRANSDERMAL
  Filled 2022-07-31: qty 1

## 2022-07-31 MED ORDER — POTASSIUM CHLORIDE IN NACL 20-0.9 MEQ/L-% IV SOLN
INTRAVENOUS | Status: DC
  Filled 2022-07-31 (×3): qty 1000

## 2022-07-31 MED ORDER — HEPARIN SODIUM (PORCINE) 5000 UNIT/ML IJ SOLN
5000.0000 [IU] | INTRAMUSCULAR | Status: AC
  Administered 2022-07-31: 5000 [IU] via SUBCUTANEOUS
  Filled 2022-07-31: qty 1

## 2022-07-31 MED ORDER — LACTATED RINGERS IV SOLN: INTRAVENOUS | Status: DC

## 2022-07-31 MED ORDER — LACTATED RINGERS IR SOLN
Status: DC | PRN
  Administered 2022-07-31: 1000 mL

## 2022-07-31 MED ORDER — BUPIVACAINE HCL 0.25 % IJ SOLN
INTRAMUSCULAR | Status: AC
  Filled 2022-07-31: qty 1

## 2022-07-31 MED ORDER — FENTANYL CITRATE (PF) 100 MCG/2ML IJ SOLN
INTRAMUSCULAR | Status: DC | PRN
  Administered 2022-07-31: 100 ug via INTRAVENOUS

## 2022-07-31 MED ORDER — HEPARIN SODIUM (PORCINE) 5000 UNIT/ML IJ SOLN
5000.0000 [IU] | Freq: Three times a day (TID) | INTRAMUSCULAR | Status: DC
  Administered 2022-07-31: 5000 [IU] via SUBCUTANEOUS
  Filled 2022-07-31 (×2): qty 1

## 2022-07-31 MED ORDER — SIMETHICONE 80 MG PO CHEW: 80.0000 mg | CHEWABLE_TABLET | Freq: Four times a day (QID) | ORAL | Status: DC | PRN

## 2022-07-31 MED ORDER — PROPOFOL 10 MG/ML IV BOLUS
INTRAVENOUS | Status: DC | PRN
  Administered 2022-07-31: 200 mg via INTRAVENOUS

## 2022-07-31 MED ORDER — ORAL CARE MOUTH RINSE: 15.0000 mL | Freq: Once | OROMUCOSAL | Status: AC

## 2022-07-31 MED ORDER — ONDANSETRON HCL 4 MG/2ML IJ SOLN
4.0000 mg | Freq: Four times a day (QID) | INTRAMUSCULAR | Status: DC | PRN
  Administered 2022-07-31: 4 mg via INTRAVENOUS
  Filled 2022-07-31: qty 2

## 2022-07-31 MED ORDER — MIDAZOLAM HCL 5 MG/5ML IJ SOLN
INTRAMUSCULAR | Status: DC | PRN
  Administered 2022-07-31: 2 mg via INTRAVENOUS

## 2022-07-31 MED ORDER — ACETAMINOPHEN 500 MG PO TABS
1000.0000 mg | ORAL_TABLET | Freq: Three times a day (TID) | ORAL | Status: DC
  Administered 2022-07-31 – 2022-08-01 (×2): 1000 mg via ORAL
  Filled 2022-07-31 (×2): qty 2

## 2022-07-31 MED ORDER — FENTANYL CITRATE PF 50 MCG/ML IJ SOSY
25.0000 ug | PREFILLED_SYRINGE | INTRAMUSCULAR | Status: DC | PRN
  Administered 2022-07-31 (×3): 50 ug via INTRAVENOUS

## 2022-07-31 MED ORDER — FENTANYL CITRATE PF 50 MCG/ML IJ SOSY
PREFILLED_SYRINGE | INTRAMUSCULAR | Status: AC
  Filled 2022-07-31: qty 2

## 2022-07-31 MED ORDER — PHENYLEPHRINE 80 MCG/ML (10ML) SYRINGE FOR IV PUSH (FOR BLOOD PRESSURE SUPPORT)
PREFILLED_SYRINGE | INTRAVENOUS | Status: DC | PRN
  Administered 2022-07-31: 160 ug via INTRAVENOUS
  Administered 2022-07-31 (×2): 80 ug via INTRAVENOUS

## 2022-07-31 MED ORDER — 0.9 % SODIUM CHLORIDE (POUR BTL) OPTIME
TOPICAL | Status: DC | PRN
  Administered 2022-07-31: 1000 mL

## 2022-07-31 MED ORDER — PANTOPRAZOLE SODIUM 40 MG IV SOLR
40.0000 mg | Freq: Every day | INTRAVENOUS | Status: DC
  Administered 2022-07-31: 40 mg via INTRAVENOUS
  Filled 2022-07-31: qty 10

## 2022-07-31 MED ORDER — SODIUM CHLORIDE 0.9 % IV SOLN
12.5000 mg | Freq: Four times a day (QID) | INTRAVENOUS | Status: DC | PRN
  Administered 2022-07-31: 12.5 mg via INTRAVENOUS
  Filled 2022-07-31: qty 12.5

## 2022-07-31 MED ORDER — ACETAMINOPHEN 160 MG/5ML PO SOLN
1000.0000 mg | Freq: Three times a day (TID) | ORAL | Status: DC
  Administered 2022-08-01: 1000 mg via ORAL
  Filled 2022-07-31: qty 40.6

## 2022-07-31 MED ORDER — MORPHINE SULFATE (PF) 2 MG/ML IV SOLN
1.0000 mg | INTRAVENOUS | Status: DC | PRN
  Administered 2022-08-01: 2 mg via INTRAVENOUS
  Filled 2022-07-31: qty 1

## 2022-07-31 MED ORDER — BUPIVACAINE LIPOSOME 1.3 % IJ SUSP
INTRAMUSCULAR | Status: DC | PRN
  Administered 2022-07-31: 50 mL

## 2022-07-31 MED ORDER — CHLORHEXIDINE GLUCONATE 0.12 % MT SOLN
15.0000 mL | Freq: Once | OROMUCOSAL | Status: AC
  Administered 2022-07-31: 15 mL via OROMUCOSAL

## 2022-07-31 SURGICAL SUPPLY — 82 items
ANTIFOG SOL W/FOAM PAD STRL (MISCELLANEOUS) ×2
APL PRP STRL LF DISP 70% ISPRP (MISCELLANEOUS) ×4
APL SRG 32X5 SNPLK LF DISP (MISCELLANEOUS)
APL SWBSTK 6 STRL LF DISP (MISCELLANEOUS)
APPLICATOR COTTON TIP 6 STRL (MISCELLANEOUS) IMPLANT
APPLICATOR COTTON TIP 6IN STRL (MISCELLANEOUS)
APPLIER CLIP ROT 10 11.4 M/L (STAPLE)
APPLIER CLIP ROT 13.4 12 LRG (CLIP)
APR CLP LRG 13.4X12 ROT 20 MLT (CLIP)
APR CLP MED LRG 11.4X10 (STAPLE)
BAG COUNTER SPONGE SURGICOUNT (BAG) IMPLANT
BAG SPNG CNTER NS LX DISP (BAG)
BLADE SURG SZ11 CARB STEEL (BLADE) ×2 IMPLANT
CABLE HIGH FREQUENCY MONO STRZ (ELECTRODE) IMPLANT
CHLORAPREP W/TINT 26 (MISCELLANEOUS) ×4 IMPLANT
CLIP APPLIE ROT 10 11.4 M/L (STAPLE) IMPLANT
CLIP APPLIE ROT 13.4 12 LRG (CLIP) IMPLANT
COVER SURGICAL LIGHT HANDLE (MISCELLANEOUS) ×2 IMPLANT
DEVICE SUT QUICK LOAD TK 5 (SUTURE) IMPLANT
DEVICE SUT TI-KNOT TK 5X26 (SUTURE) IMPLANT
DEVICE SUTURE ENDOST 10MM (ENDOMECHANICALS) IMPLANT
DISSECTOR BLUNT TIP ENDO 5MM (MISCELLANEOUS) IMPLANT
DRAPE UTILITY XL STRL (DRAPES) ×4 IMPLANT
DRSG TEGADERM 2-3/8X2-3/4 SM (GAUZE/BANDAGES/DRESSINGS) ×12 IMPLANT
ELECT L-HOOK LAP 45CM DISP (ELECTROSURGICAL)
ELECT REM PT RETURN 15FT ADLT (MISCELLANEOUS) ×2 IMPLANT
ELECTRODE L-HOOK LAP 45CM DISP (ELECTROSURGICAL) IMPLANT
GAUZE SPONGE 2X2 8PLY STRL LF (GAUZE/BANDAGES/DRESSINGS) IMPLANT
GAUZE SPONGE 4X4 12PLY STRL (GAUZE/BANDAGES/DRESSINGS) IMPLANT
GLOVE BIO SURGEON STRL SZ7.5 (GLOVE) ×2 IMPLANT
GLOVE INDICATOR 8.0 STRL GRN (GLOVE) ×2 IMPLANT
GOWN STRL REUS W/ TWL XL LVL3 (GOWN DISPOSABLE) ×6 IMPLANT
GOWN STRL REUS W/TWL XL LVL3 (GOWN DISPOSABLE) ×6
GRASPER SUT TROCAR 14GX15 (MISCELLANEOUS) ×2 IMPLANT
IRRIG SUCT STRYKERFLOW 2 WTIP (MISCELLANEOUS) ×2
IRRIGATION SUCT STRKRFLW 2 WTP (MISCELLANEOUS) ×2 IMPLANT
KIT BASIN OR (CUSTOM PROCEDURE TRAY) ×2 IMPLANT
KIT TURNOVER KIT A (KITS) IMPLANT
MARKER SKIN DUAL TIP RULER LAB (MISCELLANEOUS) ×2 IMPLANT
MAT PREVALON FULL STRYKER (MISCELLANEOUS) ×2 IMPLANT
NDL SPNL 22GX3.5 QUINCKE BK (NEEDLE) ×2 IMPLANT
NEEDLE SPNL 22GX3.5 QUINCKE BK (NEEDLE) ×2 IMPLANT
PACK UNIVERSAL I (CUSTOM PROCEDURE TRAY) ×2 IMPLANT
PENCIL SMOKE EVACUATOR (MISCELLANEOUS) IMPLANT
RELOAD STAPLE 60 3.6 BLU REG (STAPLE) ×2 IMPLANT
RELOAD STAPLE 60 3.8 GOLD REG (STAPLE) IMPLANT
RELOAD STAPLE 60 4.1 GRN THCK (STAPLE) ×2 IMPLANT
RELOAD STAPLE 60 BLK VRY/THCK (STAPLE) IMPLANT
RELOAD STAPLER 60MM BLK (STAPLE) IMPLANT
RELOAD STAPLER BLUE 60MM (STAPLE) ×8 IMPLANT
RELOAD STAPLER GOLD 60MM (STAPLE) ×2 IMPLANT
RELOAD STAPLER GREEN 60MM (STAPLE) IMPLANT
SCISSORS LAP 5X45 EPIX DISP (ENDOMECHANICALS) IMPLANT
SEALANT SURGICAL APPL DUAL CAN (MISCELLANEOUS) IMPLANT
SET TUBE SMOKE EVAC HIGH FLOW (TUBING) ×2 IMPLANT
SHEARS HARMONIC ACE PLUS 45CM (MISCELLANEOUS) ×2 IMPLANT
SLEEVE ADV FIXATION 5X100MM (TROCAR) ×4 IMPLANT
SLEEVE GASTRECTOMY 40FR VISIGI (MISCELLANEOUS) ×2 IMPLANT
SOLUTION ANTFG W/FOAM PAD STRL (MISCELLANEOUS) ×2 IMPLANT
SPIKE FLUID TRANSFER (MISCELLANEOUS) ×2 IMPLANT
STAPLE LINE REINFORCEMENT LAP (STAPLE) IMPLANT
STAPLER ECHELON BIOABSB 60 FLE (MISCELLANEOUS) IMPLANT
STAPLER ECHELON LONG 60 440 (INSTRUMENTS) ×2 IMPLANT
STAPLER RELOAD 60MM BLK (STAPLE)
STAPLER RELOAD BLUE 60MM (STAPLE) ×8
STAPLER RELOAD GOLD 60MM (STAPLE) ×2
STAPLER RELOAD GREEN 60MM (STAPLE)
STRIP CLOSURE SKIN 1/2X4 (GAUZE/BANDAGES/DRESSINGS) ×2 IMPLANT
SUT MNCRL AB 4-0 PS2 18 (SUTURE) ×2 IMPLANT
SUT SURGIDAC NAB ES-9 0 48 120 (SUTURE) IMPLANT
SUT VICRYL 0 TIES 12 18 (SUTURE) ×2 IMPLANT
SYR 20ML LL LF (SYRINGE) ×2 IMPLANT
SYR 50ML LL SCALE MARK (SYRINGE) ×2 IMPLANT
SYS KII OPTICAL ACCESS 15MM (TROCAR) ×2
SYSTEM KII OPTICAL ACCESS 15MM (TROCAR) ×2 IMPLANT
TOWEL OR 17X26 10 PK STRL BLUE (TOWEL DISPOSABLE) ×2 IMPLANT
TOWEL OR NON WOVEN STRL DISP B (DISPOSABLE) ×2 IMPLANT
TROCAR ADV FIXATION 5X100MM (TROCAR) ×2 IMPLANT
TROCAR XCEL NON-BLD 5MMX100MML (ENDOMECHANICALS) ×2 IMPLANT
TROCAR Z-THREAD OPTICAL 5X100M (TROCAR) IMPLANT
TUBING CONNECTING 10 (TUBING) ×4 IMPLANT
TUBING ENDO SMARTCAP (MISCELLANEOUS) ×2 IMPLANT

## 2022-07-31 NOTE — Progress Notes (Signed)
Patient seen in Short Stay prior to surgery. Discussed pain and nausea control.  BSTOP education provided including BSTOP information guide, "Guide for Pain Management after your Bariatric Procedure".  Diet progression education provided including "Bariatric Surgery Post-Op Food Plan Phase 1: Liquids".  Questions answered.  Will continue to partner with bedside RN and follow up with patient per protocol after surgery complete.    Thank you,  Lubertha Basque, RN, MSN Bariatric Nurse Coordinator [phone] (office)

## 2022-07-31 NOTE — Op Note (Signed)
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)  Elevated LDL cholesterol level   POST-OPERATIVE DIAGNOSIS:  same  PROCEDURE:  Procedure(s): LAPAROSCOPIC SLEEVE GASTRECTOMY  UPPER GI ENDOSCOPY Laparoscopic bilateral TAP block  SURGEON:  Surgeon(s): Atilano Ina, MD FACS FASMBS  ASSISTANTS: Phylliss Blakes MD FACS  ANESTHESIA:   general  DRAINS: none   BOUGIE: 40 fr ViSiGi  LOCAL MEDICATIONS USED:   Exparel  EBL: minimal  SPECIMEN:  Source of Specimen:  Greater curvature of stomach  DISPOSITION OF SPECIMEN:  PATHOLOGY  COUNTS:  YES  INDICATION FOR PROCEDURE: This is a very pleasant 37 y.o.-year-old morbidly obese female who has had unsuccessful attempts for sustained weight loss. The patient presents today for a planned laparoscopic sleeve gastrectomy with upper endoscopy. We have discussed the risk and benefits of the procedure extensively preoperatively. Please see my separate notes.  PROCEDURE: After obtaining informed consent and receiving 5000 units of subcutaneous heparin, the patient was brought to the operating room at [facility] and placed supine on the operating room table. General endotracheal anesthesia was established. Sequential compression devices were placed. A orogastric tube was placed. The patient's abdomen was prepped and draped in the usual standard surgical fashion. The patient received preoperative IV antibiotic. A surgical timeout was performed. ERAS protocol used.   Access to the abdomen was achieved using a 5 mm 0 laparoscope thru a 5 mm trocar In the left upper Quadrant 2 fingerbreadths below the left subcostal margin using the Optiview technique. Pneumoperitoneum was smoothly established up to 15 mm of mercury. The laparoscope was advanced and the abdominal cavity was surveilled. The patient was then placed in reverse Trendelenburg.    A 5 mm trocar was placed slightly above and to the left of the umbilicus under direct visualization.  The [facility] liver retractor was placed under the left lobe of the liver through a 5 mm trocar incision site in the subxiphoid position. A 5 mm trocar was placed in the lateral right upper quadrant along with a 15 mm trocar in the mid right abdomen. A final 5 mm trocar was placed in the lateral LUQ.  All under direct visualization after exparel had been infiltrated in bilateral lateral upper abdominal walls as a TAP block for postoperative pain relief.  The stomach was inspected. It was completely decompressed and the orogastric tube was removed.  There was no small anterior dimple that was obviously visible. Preop upper gi showed no hiatal hernia.   We identified the pylorus and measured 6 cm proximal to the pylorus and identified an area of where we would start taking down the short gastric vessels. Harmonic scalpel was used to take down the short gastric vessels along the greater curvature of the stomach. We were able to enter the lesser sac. We continued to march along the greater curvature of the stomach taking down the short gastrics. As we approached the gastrosplenic ligament we took care in this area not to injure the spleen. We were able to take down the entire gastrosplenic ligament. We then mobilized the fundus away from the left crus of diaphragm. There were a few posterior gastric avascular attachments which were taken down. This left the stomach completely mobilized. No vessels had been taken down along the lesser curvature of the stomach.  We then reidentified the pylorus. A 40Fr ViSiGi was then placed in the oropharynx and advanced down into the  stomach and placed in the distal antrum and positioned along the lesser curvature. It was placed under suction which secured the 40Fr ViSiGi in place along the lesser curve. Then using the Ethicon echelon 60 mm stapler with a gold load with ethicon  staple line reinforcement (ESLR), I placed a stapler along the antrum approximately 5 cm from the pylorus. The stapler was angled so that there is ample room at the angularis incisura. I then fired the first staple load after inspecting it posteriorly to ensure adequate space both anteriorly and posteriorly.  At this point I started using 60 mm blue load staple cartridges with ESLR. The echelon stapler was then repositioned with a 60 mm blue load with ESLR and we continued to march up along the ViSiGi. My assistant was holding traction along the greater curvature stomach along the cauterized short gastric vessels ensuring that the stomach was symmetrically retracted. Prior to each firing of the staple, we rotated the stomach to ensure that there is adequate stomach left.  As we approached the fundus, I used 60 mm blue cartridge with ESLR aiming  lateral to the GE junction after mobilizing some of the esophageal fat pad.  The sleeve was inspected. There is no evidence of cork screw. The staple line appeared hemostatic. The CRNA inflated the ViSiGi to the green zone and the upper abdomen was flooded with saline. There were no bubbles. The sleeve was decompressed and the ViSiGi removed.  Pneumoperitoneum was reduced to 8 mmHg so we can inspect for bleeding along the staple line.  My assistant scrubbed out and performed an upper endoscopy. The sleeve easily distended with air and the scope was easily advanced to the pylorus. There is no evidence of internal bleeding or cork screwing. There was no narrowing at the angularis. There is no evidence of bubbles. Please see her operative note for further details. The gastric sleeve was decompressed and the endoscope was removed.  The greater curvature the stomach was grasped with a laparoscopic grasper and removed from the 15 mm trocar site.  The liver retractor was removed. I then closed the 15 mm trocar site with 1 interrupted 0 Vicryl sutures through the fascia using the  endoclose. The closure was viewed laparoscopically and it was airtight. Remaining Exparel was then infiltrated in the preperitoneal spaces around the trocar sites. Pneumoperitoneum was released. All trocar sites were closed with a 4-0 Monocryl in a subcuticular fashion followed by the application of steri-strips, and bandaids. The patient was extubated and taken to the recovery room in stable condition. All needle, instrument, and sponge counts were correct x2. There are no immediate complications  (0) 60 mm green with ESLR (1) 60 mm gold with ESLR (4) 60 mm blue with ESLR (1) 60 mm white  PLAN OF CARE: Admit to inpatient   PATIENT DISPOSITION:  PACU - hemodynamically stable.   Delay start of Pharmacological VTE agent (>24hrs) due to surgical blood loss or risk of bleeding:  no  [name]. Andrey Campanile, MD, FACS FASMBS General, Bariatric, & Minimally Invasive Surgery [facility] Surgery, [place]

## 2022-07-31 NOTE — Progress Notes (Signed)
Rounded with patient, pt resting in recliner. Reinforced education on Goals for Discharge, document with patient including ambulation in halls, Incentive Spirometry use every hour, and oral care.   Thank you,  Lubertha Basque, RN, MSN Bariatric Nurse Coordinator [phone] (office)

## 2022-07-31 NOTE — Progress Notes (Signed)
PHARMACY CONSULT FOR:  Risk Assessment for Post-Discharge VTE Following Bariatric Surgery  Post-Discharge VTE Risk Assessment: This patient's probability of 30-day post-discharge VTE is increased due to the factors marked: x Sleeve gastrectomy   Liver disorder (transplant, cirrhosis, or nonalcoholic steatohepatitis)   Hx of VTE   Hemorrhage requiring transfusion   GI perforation, leak, or obstruction   ====================================================    Female    Age >/=60 years    BMI >/=50 kg/m2    CHF    Dyspnea at Rest    Paraplegia    Non-gastric-band surgery    Operation Time >/=3 hr    Return to OR     Length of Stay >/= 3 d   Hypercoagulable condition   Significant venous stasis    Predicted probability of 30-day post-discharge VTE: 0.16  mild  Other patient-specific factors to consider: none  Recommendation for Discharge: No pharmacologic prophylaxis post-discharge   Sandy Mata is a 37 y.o. female who underwent   6/24: LAPAROSCOPIC SLEEVE GASTRECTOMY  Case start 1405 Case end: 1456   No Known Allergies  Patient Measurements: Height: 5' 7.5" (171.5 cm) Weight: (!) 141.1 kg (311 lb) IBW/kg (Calculated) : 62.75 Body mass index is 47.99 kg/m.  No results for input(s): "WBC", "HGB", "HCT", "PLT", "APTT", "CREATININE", "LABCREA", "CREAT24HRUR", "MG", "PHOS", "ALBUMIN", "PROT", "AST", "ALT", "ALKPHOS", "BILITOT", "BILIDIR", "IBILI" in the last 72 hours. Estimated Creatinine Clearance: 132 mL/min (by C-G formula based on SCr of 0.85 mg/dL).    Past Medical History:  Diagnosis Date   Complication of anesthesia    slow to wake up   Depression    quit meds with pregnancy   Dermoid cyst of ovary    Diabetes mellitus without complication (HCC)    Diarrhea    History of chlamydia    IBS (irritable bowel syndrome)    Medical history non-contributory      Medications Prior to Admission  Medication Sig Dispense Refill Last Dose   ascorbic acid  (VITAMIN C) 1000 MG tablet Take 1,000 mg by mouth daily.   Past Week   buPROPion (WELLBUTRIN XL) 150 MG 24 hr tablet Take 150 mg by mouth every morning.   07/31/2022 at 0830   Cholecalciferol (VITAMIN D-1000 MAX ST) 25 MCG (1000 UT) tablet Take 1,000 Units by mouth daily.   Past Week   citalopram (CELEXA) 10 MG tablet Take 10 mg by mouth daily.    at 0830   Levonorgestrel (MIRENA IU) by Intrauterine route.   07/31/2022   magnesium oxide (MAG-OX) 400 MG tablet Take 400 mg by mouth daily.   Past Week   LORazepam (ATIVAN) 0.5 MG tablet Take 0.5 mg by mouth daily as needed for anxiety.   More than a month      [name] S. Merilynn Finland, PharmD, BCPS Clinical Staff Pharmacist [email or web address]  Merilynn Finland, Relda Agosto Stillinger 07/31/2022,3:21 PM

## 2022-07-31 NOTE — Interval H&P Note (Signed)
History and Physical Interval Note:  07/31/2022 1:24 PM  Sandy Mata  has presented today for surgery, with the diagnosis of morbid obesity.  The various methods of treatment have been discussed with the patient and family. After consideration of risks, benefits and other options for treatment, the patient has consented to  Procedure(s): LAPAROSCOPIC SLEEVE GASTRECTOMY (N/A) UPPER GI ENDOSCOPY (N/A) as a surgical intervention.  The patient's history has been reviewed, patient examined, no change in status, stable for surgery.  I have reviewed the patient's chart and labs.  Questions were answered to the patient's satisfaction.     Gaynelle Adu

## 2022-07-31 NOTE — H&P (Signed)
PROVIDER: Kahlia Lagunes Sherril Cong, MD  MRN: [id number] DOB: Dec 30, 1985 DATE OF ENCOUNTER: 07/06/2022 Subjective  Chief Complaint: Follow Up Visit (WEIGHT LOSS)   History of Present Illness: Sandy Mata is a 37 y.o. female who is seen today for internal follow-up regarding her severe obesity and diabetes mellitus type 2. I initially met her back in April to discuss bariatric surgery. She has completed the bariatric surgery evaluation process.[name] has completed medical, nutritional and psychological evaluation  She denies any changes since I saw her back in April. No trips to the emergency room or hospital. No fever or chills, nausea vomiting, diarrhea or constipation. No abdominal pain. No heartburn or reflux.  Review of Systems: A complete review of systems was obtained from the patient. I have reviewed this information and discussed as appropriate with the patient. See HPI as well for other ROS.  ROS  Medical History: Past Medical History: Diagnosis Date Anxiety Diabetes mellitus without complication (CMS/HHS-HCC)  Patient Active Problem List Diagnosis Personal history of colonic polyps Morbid obesity (CMS/HHS-HCC) Family history of Crohn's disease Family history of cancer Chronic idiopathic constipation  Past Surgical History: Procedure Laterality Date CHOLECYSTECTOMY 09/2002 TONSILLECTOMY 01/2003 CYST ON OVARY 01/2014   Allergies Allergen Reactions Saxenda [Liraglutide] Other (See Comments) Facial swelling Trulicity [Dulaglutide] Vomiting  Current Outpatient Medications on File Prior to Visit Medication Sig Dispense Refill ascorbic acid, vitamin C, (VITAMIN C) 1000 MG tablet Take 1,000 mg by mouth once daily buPROPion (WELLBUTRIN XL) 150 MG XL tablet Take 150 mg by mouth every morning cholecalciferol 1000 unit tablet Take by mouth citalopram (CELEXA) 10 MG tablet Take 10 mg by mouth once daily magnesium oxide (MAG-OX) 400 mg (241.3 mg magnesium) tablet Take 400 mg by  mouth once daily  No current facility-administered medications on file prior to visit.  History reviewed. No pertinent family history.  Social History  Tobacco Use Smoking Status Never Smokeless Tobacco Never   Social History  Socioeconomic History Marital status: Married Tobacco Use Smoking status: Never Smokeless tobacco: Never Vaping Use Vaping status: Never Used Substance and Sexual Activity Alcohol use: Not Currently Drug use: Never  Objective:  Vitals: 07/06/22 0842 BP: 131/82 Pulse: 69 Temp: 36.3 C (97.4 F) SpO2: 99% Weight: (!) 143.1 kg (315 lb 6.4 oz) Height: 171.5 cm (5' 7.5") PainSc: 0-No pain  Body mass index is 48.67 kg/m.  Gen: alert, NAD, non-toxic appearing Pupils: equal, no scleral icterus Pulm: Lungs clear to auscultation, symmetric chest rise CV: regular rate and rhythm Abd: soft, nontender, nondistended. old trocar sites. No cellulitis. No incisional hernia Ext: no edema, Skin: no rash, no jaundice  Labs, Imaging and Diagnostic Testing: Labs from April 12 were reviewed. basic metabolic panel, CBC, vitamin D, H. pylori breath test, lipid panel, vitamin B12; hgb A1c 6 LDL 101; TSH 5.48  Upper GI 05/31/22-within normal limit  Assessment and Plan: Diagnoses and all orders for this visit:  Severe obesity (CMS/HHS-HCC)  Type 2 diabetes mellitus with obesity (CMS/HHS-HCC)  Elevated LDL cholesterol level  Elevated TSH    She has completed the bariatric surgery evaluation process. We rediscussed sleeve gastrectomy versus Roux-en-Y gastric bypass briefly. She has decided to go with sleeve gastrectomy. We reviewed her labs and imaging. We discussed her TSH level she has a follow-up appointment with her PCP in the near future. She has completed her preoperative education class. We rediscussed the typical hospitalization and the typical recovery. I offered to rediscussed steps and benefits along with risk but she declined. She read over  the  surgical consent form and signed. We discussed the importance of the preoperative meal plan.  This patient encounter took 22 minutes today to perform the following: take history, perform exam, review outside records, interpret imaging, counsel the patient on their diagnosis and document encounter, findings & plan in the EHR  No follow-ups on file.  Sandy Mata. Andrey Campanile MD FACS General, Minimally Invasive, & Bariatric Surgery Electronically signed by Sandy Kroner, MD at 07/06/2022 9:03 AM EDT   Sandy Mata. Andrey Campanile, MD, FACS General, Bariatric, & Minimally Invasive Surgery [facility],  [facility]

## 2022-07-31 NOTE — Anesthesia Procedure Notes (Signed)
Procedure Name: Intubation Date/Time: 07/31/2022 1:46 PM  Performed by: Basilio Cairo, CRNAPre-anesthesia Checklist: Patient identified, Patient being monitored, Timeout performed, Emergency Drugs available and Suction available Patient Re-evaluated:Patient Re-evaluated prior to induction Oxygen Delivery Method: Circle system utilized Preoxygenation: Pre-oxygenation with 100% oxygen Induction Type: IV induction Ventilation: Mask ventilation without difficulty Laryngoscope Size: Mac and 3 Grade View: Grade I Tube type: Oral Tube size: 7.5 mm Number of attempts: 1 Airway Equipment and Method: Stylet Placement Confirmation: ETT inserted through vocal cords under direct vision, positive ETCO2 and breath sounds checked- equal and bilateral Secured at: 21 cm Tube secured with: Tape Dental Injury: Teeth and Oropharynx as per pre-operative assessment

## 2022-07-31 NOTE — Op Note (Signed)
Preoperative diagnosis: laparoscopic sleeve gastrectomy  Postoperative diagnosis: Same   Procedure: Upper endoscopy   Surgeon: Griffin Gerrard A Merric Yost, M.D.  Anesthesia: Gen.   Description of procedure: The endoscope was placed in the mouth and oropharynx and under endoscopic vision it was advanced to the esophagogastric junction which was identified at 38cm from the teeth.  The pouch was tensely insufflated while the upper abdomen was flooded with irrigation to perform a leak test, which was negative. No bubbles were seen.  The staple line was hemostatic and the lumen was evenly tubular without undue narrowing, angulation or twisting specifically at the incisura angularis. The lumen was decompressed and the scope was withdrawn without difficulty.    Martia Dalby A Phylicia Mcgaugh, M.D. General, Bariatric, & Minimally Invasive Surgery [facility]   

## 2022-07-31 NOTE — Anesthesia Preprocedure Evaluation (Addendum)
Anesthesia Evaluation  Patient identified by MRN, date of birth, ID band Patient awake    Reviewed: Allergy & Precautions, NPO status , Patient's Chart, lab work & pertinent test results  Airway Mallampati: II  TM Distance: >3 FB Neck ROM: Full    Dental  (+) Dental Advisory Given   Pulmonary neg pulmonary ROS   breath sounds clear to auscultation       Cardiovascular negative cardio ROS  Rhythm:Regular Rate:Normal     Neuro/Psych negative neurological ROS     GI/Hepatic negative GI ROS, Neg liver ROS,,,  Endo/Other  diabetes, Type 2, Oral Hypoglycemic Agents  Morbid obesity  Renal/GU negative Renal ROS     Musculoskeletal   Abdominal   Peds  Hematology negative hematology ROS (+)   Anesthesia Other Findings   Reproductive/Obstetrics                             Anesthesia Physical Anesthesia Plan  ASA: 3  Anesthesia Plan: General   Post-op Pain Management: Tylenol PO (pre-op)* and Ketamine IV*   Induction: Intravenous  PONV Risk Score and Plan: 4 or greater and Scopolamine patch - Pre-op, Midazolam, Dexamethasone, Ondansetron, Treatment may vary due to age or medical condition and Aprepitant  Airway Management Planned: Oral ETT  Additional Equipment:   Intra-op Plan:   Post-operative Plan: Extubation in OR  Informed Consent: I have reviewed the patients History and Physical, chart, labs and discussed the procedure including the risks, benefits and alternatives for the proposed anesthesia with the patient or authorized representative who has indicated his/her understanding and acceptance.     Dental advisory given  Plan Discussed with: CRNA  Anesthesia Plan Comments:        Anesthesia Quick Evaluation

## 2022-07-31 NOTE — Transfer of Care (Signed)
Immediate Anesthesia Transfer of Care Note  Patient: Sandy Mata  Procedure(s) Performed: Procedure(s): LAPAROSCOPIC SLEEVE GASTRECTOMY (N/A) UPPER GI ENDOSCOPY (N/A)  Patient Location: PACU  Anesthesia Type:General  Level of Consciousness: Alert, Awake, Oriented  Airway & Oxygen Therapy: Patient Spontanous Breathing  Post-op Assessment: Report given to RN  Post vital signs: Reviewed and stable  Last Vitals:  Vitals:   07/31/22 1143 07/31/22 1505  BP: (!) 151/90 128/71  Pulse: 85 85  Resp: 16 19  Temp: 36.6 C (!) 36.2 C  SpO2: 97% 100%    Complications: No apparent anesthesia complications

## 2022-07-31 NOTE — Discharge Instructions (Signed)

## 2022-08-01 ENCOUNTER — Encounter (HOSPITAL_COMMUNITY): Payer: Self-pay | Admitting: General Surgery

## 2022-08-01 LAB — CBC WITH DIFFERENTIAL/PLATELET
Abs Immature Granulocytes: 0.07 10*3/uL (ref 0.00–0.07)
Basophils Absolute: 0 10*3/uL (ref 0.0–0.1)
Basophils Relative: 0 %
Eosinophils Absolute: 0 10*3/uL (ref 0.0–0.5)
Eosinophils Relative: 0 %
HCT: 37.1 % (ref 36.0–46.0)
Hemoglobin: 11.7 g/dL — ABNORMAL LOW (ref 12.0–15.0)
Immature Granulocytes: 1 %
Lymphocytes Relative: 8 %
Lymphs Abs: 1 10*3/uL (ref 0.7–4.0)
MCH: 27.3 pg (ref 26.0–34.0)
MCHC: 31.5 g/dL (ref 30.0–36.0)
MCV: 86.7 fL (ref 80.0–100.0)
Monocytes Absolute: 0.3 10*3/uL (ref 0.1–1.0)
Monocytes Relative: 2 %
Neutro Abs: 12.4 10*3/uL — ABNORMAL HIGH (ref 1.7–7.7)
Neutrophils Relative %: 89 %
Platelets: 307 10*3/uL (ref 150–400)
RBC: 4.28 MIL/uL (ref 3.87–5.11)
RDW: 14.8 % (ref 11.5–15.5)
WBC: 13.8 10*3/uL — ABNORMAL HIGH (ref 4.0–10.5)
nRBC: 0 % (ref 0.0–0.2)

## 2022-08-01 LAB — COMPREHENSIVE METABOLIC PANEL
ALT: 21 U/L (ref 0–44)
AST: 18 U/L (ref 15–41)
Albumin: 3.5 g/dL (ref 3.5–5.0)
Alkaline Phosphatase: 52 U/L (ref 38–126)
Anion gap: 9 (ref 5–15)
BUN: 12 mg/dL (ref 6–20)
CO2: 22 mmol/L (ref 22–32)
Calcium: 8.5 mg/dL — ABNORMAL LOW (ref 8.9–10.3)
Chloride: 106 mmol/L (ref 98–111)
Creatinine, Ser: 0.77 mg/dL (ref 0.44–1.00)
GFR, Estimated: >60 mL/min (ref >60)
Glucose, Bld: 168 mg/dL — ABNORMAL HIGH (ref 70–99)
Potassium: 4.1 mmol/L (ref 3.5–5.1)
Sodium: 137 mmol/L (ref 135–145)
Total Bilirubin: 0.4 mg/dL (ref 0.3–1.2)
Total Protein: 6.9 g/dL (ref 6.5–8.1)

## 2022-08-01 LAB — GLUCOSE, CAPILLARY
Glucose-Capillary: 171 mg/dL — ABNORMAL HIGH (ref 70–99)
Glucose-Capillary: 150 mg/dL — ABNORMAL HIGH (ref 70–99)
Glucose-Capillary: 108 mg/dL — ABNORMAL HIGH (ref 70–99)

## 2022-08-01 MED ORDER — ACETAMINOPHEN 500 MG PO TABS
1000.0000 mg | ORAL_TABLET | Freq: Three times a day (TID) | ORAL | 0 refills | Status: AC
Start: 2022-08-01 — End: 2022-08-06

## 2022-08-01 MED ORDER — TRAMADOL HCL 50 MG PO TABS
50.0000 mg | ORAL_TABLET | Freq: Four times a day (QID) | ORAL | 0 refills | Status: AC | PRN
Start: 2022-08-01 — End: ?

## 2022-08-01 MED ORDER — PANTOPRAZOLE SODIUM 40 MG PO TBEC: 40.0000 mg | DELAYED_RELEASE_TABLET | Freq: Every day | ORAL | 0 refills | Status: AC

## 2022-08-01 MED ORDER — ONDANSETRON 4 MG PO TBDP: 4.0000 mg | ORAL_TABLET | Freq: Four times a day (QID) | ORAL | 0 refills | Status: AC | PRN

## 2022-08-01 NOTE — Progress Notes (Signed)
Patient alert and oriented, pain is controlled. Patient is tolerating fluids, advanced to protein shake today, patient is tolerating well. Reviewed Gastric sleeve/bypass discharge instructions with patient and patient is able to articulate understanding. Provided information on BELT program, Support Group, BSTOP-D, and [facility] outpatient pharmacy. Communicated general update of patient status to surgeon. All questions answered. 24hr fluid recall is 240 mL per hydration protocol, bariatric nurse coordinator to make follow-up phone call within one week.

## 2022-08-01 NOTE — Progress Notes (Signed)
   08/01/22 1157  TOC Brief Assessment  Insurance and Status Reviewed  Patient has primary care physician Yes  Home environment has been reviewed Resides with spouse  Prior level of function: Independent at baseline  Prior/Current Home Services No current home services  Social Determinants of Health Reivew SDOH reviewed no interventions necessary  Readmission risk has been reviewed Yes  Transition of care needs no transition of care needs at this time

## 2022-08-02 NOTE — Anesthesia Postprocedure Evaluation (Signed)
Anesthesia Post Note  Patient: Sandy Mata  Procedure(s) Performed: LAPAROSCOPIC SLEEVE GASTRECTOMY (Abdomen) UPPER GI ENDOSCOPY     Patient location during evaluation: PACU Anesthesia Type: General Level of consciousness: awake and alert Pain management: pain level controlled Vital Signs Assessment: post-procedure vital signs reviewed and stable Respiratory status: spontaneous breathing, nonlabored ventilation, respiratory function stable and patient connected to nasal cannula oxygen Cardiovascular status: blood pressure returned to baseline and stable Postop Assessment: no apparent nausea or vomiting Anesthetic complications: no   No notable events documented.  Last Vitals:  Vitals:   08/01/22 1318 08/01/22 1321  BP: 133/72 133/72  Pulse:  (!) 57  Resp: 18 18  Temp: 36.7 C 36.7 C  SpO2: 98% 98%    Last Pain:  Vitals:   08/01/22 1321  TempSrc: Oral  PainSc:    Pain Goal: Patients Stated Pain Goal: 2 (08/01/22 1048)                 Kennieth Rad

## 2022-08-02 NOTE — Discharge Summary (Signed)
Physician Discharge Summary  RHAELYN Mata UXL:[id number] DOB: 14-Oct-1985 DOA: 07/31/2022  PCP: Lianne Moris, PA-C  Admit date: 07/31/2022 Discharge date: 08/01/2022  Recommendations for Outpatient Follow-up:    Follow-up Information     Gaynelle Adu, MD Follow up on 08/23/2022.   Specialty: General Surgery Why: Please arrive 15 minutes prior to your appointment at 9:30am Contact information: [address] [place] [address] [phone]         Reine Just, [place] Follow up on 09/28/2022.   Specialty: General Surgery Why: Please arrive 15 minutes prior to your appointment at 9:15am with Herbert Pun, on behalf of Dr. Lanier Prude information: [address] [facility] [place] [address] [phone]                Discharge Diagnoses:  Principal Problem:   S/P laparoscopic sleeve gastrectomy  severe obesity (bmi 48)  Type 2 diabetes mellitus with obesity (CMS/HHS-HCC)  Elevated LDL cholesterol level  Surgical Procedure: Laparoscopic Sleeve Gastrectomy, upper endoscopy  Discharge Condition: Good Disposition: Home  Diet recommendation: Postoperative sleeve gastrectomy diet (liquids only)  Filed Weights   07/31/22 1143  Weight: (!) 141.1 kg     Hospital Course:  The patient was admitted for a planned laparoscopic sleeve gastrectomy. Please see operative note. Preoperatively the patient was given 5000 units of subcutaneous heparin for DVT prophylaxis. Postoperative prophylactic heparin dosing was started on the evening of postoperative day 0. ERAS protocol was used. On the evening of postoperative day 0, the patient was started on water and ice chips. On postoperative day 1 the patient had no fever or tachycardia and was tolerating water in their diet was gradually advanced throughout the day. The patient was ambulating without difficulty. Their vital signs are stable without fever or tachycardia. Their  hemoglobin had remained stable.  The patient had received discharge instructions and counseling. They were deemed stable for discharge and had met discharge criteria  BP 133/72 (BP Location: Right Arm)   Pulse (!) 57   Temp 98.1 F (36.7 C) (Oral)   Resp 18   Ht 5' 7.5" (1.715 m)   Wt (!) 141.1 kg   SpO2 98%   BMI 47.99 kg/m     Discharge Instructions  Discharge Instructions     Ambulate hourly while awake   Complete by: As directed    Call MD for:  difficulty breathing, headache or visual disturbances   Complete by: As directed    Call MD for:  persistant dizziness or light-headedness   Complete by: As directed    Call MD for:  persistant nausea and vomiting   Complete by: As directed    Call MD for:  redness, tenderness, or signs of infection (pain, swelling, redness, odor or green/yellow discharge around incision site)   Complete by: As directed    Call MD for:  severe uncontrolled pain   Complete by: As directed    Call MD for:  temperature >101 F   Complete by: As directed    Diet bariatric full liquid   Complete by: As directed    Discharge instructions   Complete by: As directed    See bariatric discharge instructions   Discharge patient   Complete by: As directed    When pt meets discharge criteria (oral intake goals, vitals ok, ambulating)   Discharge disposition: 01-Home or Self Care   Discharge patient date: 08/01/2022   Incentive spirometry   Complete by: As  directed    Perform hourly while awake      Allergies as of 08/01/2022   No Known Allergies      Medication List     TAKE these medications    acetaminophen 500 MG tablet Commonly known as: TYLENOL Take 2 tablets (1,000 mg total) by mouth every 8 (eight) hours for 5 days.   ascorbic acid 1000 MG tablet Commonly known as: VITAMIN C Take 1,000 mg by mouth daily.   buPROPion 150 MG 24 hr tablet Commonly known as: WELLBUTRIN XL Take 150 mg by mouth every morning.   citalopram 10 MG  tablet Commonly known as: CELEXA Take 10 mg by mouth daily.   LORazepam 0.5 MG tablet Commonly known as: ATIVAN Take 0.5 mg by mouth daily as needed for anxiety.   magnesium oxide 400 MG tablet Commonly known as: MAG-OX Take 400 mg by mouth daily.   MIRENA IU by Intrauterine route. Notes to patient: Medication may not be effective for the next 30 days.  Use precaution if necessary to avoid pregnancy within the first following surgery.   ondansetron 4 MG disintegrating tablet Commonly known as: ZOFRAN-ODT Take 1 tablet (4 mg total) by mouth every 6 (six) hours as needed for nausea or vomiting.   pantoprazole 40 MG tablet Commonly known as: PROTONIX Take 1 tablet (40 mg total) by mouth daily.   traMADol 50 MG tablet Commonly known as: ULTRAM Take 1 tablet (50 mg total) by mouth every 6 (six) hours as needed (pain).   Vitamin D-1000 Max St 25 MCG (1000 UT) tablet Generic drug: Cholecalciferol Take 1,000 Units by mouth daily.        Follow-up Information     Gaynelle Adu, MD Follow up on 08/23/2022.   Specialty: General Surgery Why: Please arrive 15 minutes prior to your appointment at 9:30am Contact information: [address] [place] [address] [phone]         Reine Just, [place] Follow up on 09/28/2022.   Specialty: General Surgery Why: Please arrive 15 minutes prior to your appointment at 9:15am with Herbert Pun, on behalf of Dr. Lanier Prude information: [address] [facility] [place] [facility] [place] [address] [phone]                  The results of significant diagnostics from this hospitalization (including imaging, microbiology, ancillary and laboratory) are listed below for reference.    Significant Diagnostic Studies: DG Chest 2 View  Result Date: 07/20/2022 CLINICAL DATA:  Preoperative exam. EXAM: CHEST - 2 VIEW COMPARISON:  None Available. FINDINGS: The heart size and mediastinal  contours are within normal limits. Both lungs are clear. The visualized skeletal structures are unremarkable. IMPRESSION: No active cardiopulmonary disease. Electronically Signed   By: Annia Belt M.D.   On: 07/20/2022 17:00    Labs: Basic Metabolic Panel: Recent Labs  Lab 08/01/22 0553  NA 137  K 4.1  CL 106  CO2 22  GLUCOSE 168*  BUN 12  CREATININE 0.77  CALCIUM 8.5*   Liver Function Tests: Recent Labs  Lab 08/01/22 0553  AST 18  ALT 21  ALKPHOS 52  BILITOT 0.4  PROT 6.9  ALBUMIN 3.5    CBC: Recent Labs  Lab 07/31/22 1933 08/01/22 0605  WBC  --  13.8*  NEUTROABS  --  12.4*  HGB 13.2 11.7*  HCT 40.3 37.1  MCV  --  86.7  PLT  --  307  CBG: Recent Labs  Lab 07/31/22 1945 07/31/22 2335 08/01/22 0319 08/01/22 0731 08/01/22 1128  GLUCAP 140* 137* 171* 150* 108*    Principal Problem:   S/P laparoscopic sleeve gastrectomy   Time coordinating discharge: 15 min  Signed:  Atilano Ina, MD [facility] [phone] 08/02/2022, 12:29 PM

## 2022-08-03 LAB — SURGICAL PATHOLOGY

## 2022-08-08 ENCOUNTER — Telehealth (HOSPITAL_COMMUNITY): Payer: Self-pay | Admitting: *Deleted

## 2022-08-08 NOTE — Telephone Encounter (Signed)
1. Tell me about your pain and pain management?     Pt denies any abdominal pain.    2. Let's talk about fluid intake. How much total fluid are you taking in?   Pt states that s/he is getting in at least 60oz of fluid including protein shakes, bottled water, and ___   Pt instructed to assess status and suggestions daily utilizing Hydration Action Plan on discharge folder and to call [facility] if in the "red zone".        3. How much protein have you taken in the last day?     Pt states she is meeting the goal of 60g of protein each day with the protein shakes    4. Have you had nausea? Tell me about when you have experienced nausea and what you did to help?   Pt denies nausea.   5. Has the frequency or color changed with your urine?   Pt states that s/he is urinating "fine" with no changes in frequency or urgency.   6. Tell me what your incisions look like?   "Incisions look fine". Pt denies a fever, chills. Pt states incisions are not swollen, open, or draining. Pt encouraged to call [facility] if incisions change.      "Incisions look fine" with the exception of 1 that has lost its steri-strips "right under my breast". Pt states it "opened up a little and was stinging". Pt denies a fever, chills. Pt states that the other incisions are "fine" and are not swollen, open, or draining. Pt encouraged to call [facility] for concern about incision.      "Incisions look fine" with the exception of 1 that has lost its steri-strips. Pt states it is itching and a little red around it like a "rash". Pt denies a fever, chills. Pt states that it is not swollen, open, or draining. Pt encouraged to call [facility] if symptoms worsen.      7. Have you been passing gas? BM?    Pt states that they have had a BMs. Pt instructed to take either Miralax or MoM as instructed per "Gastric Bypass/Sleeve Discharge Home Care Instructions". Pt to call surgeon's office if not able to have BM with medication.      8. If a  problem or question were to arise who would you call? Do you know contact numbers for [facility], [facility], and [facility]?   Pt knows to call [facility] for surgical, [facility] for nutrition, and [facility] for non-urgent questions or concerns. Pt denies dehydration symptoms. Pt can describe s/sx of dehydration.   9. How has the walking going?   Pt states s/he is walking around and able to be active without difficulty.   10. Are you still using your incentive spirometer? If so, how often?   Pt states that s/he is doing the I.S. Pt encouraged to use incentive spirometer, at least 10x every hour while awake until s/he sees the surgeon.   11. How are your vitamins and calcium going? How are you taking them?     Pt states that s/he is taking his/her supplements and vitamins without extreme difficulty. Pt does not like the flavor of the vitamin and had stopped, however pt was encouraged to consider some options like taking with yogurt to help with the taste and to continue taking the multivitamin.   12. Any other questions? Pt shared on Saturday she started having some sharp pains across left shoulder at night. She called the on call surgeon and  they attributed it to gas pains and shared to use a heating pad to help. Pt stated that she still is having the sharp pains along the left shoulder. - shared with pt to also consider the appropriate size bra, as that could attribute to some discomfort as well and to reach out if she still had some concerns with the pain.

## 2022-08-15 ENCOUNTER — Encounter: Payer: Self-pay | Admitting: Dietician

## 2022-08-15 ENCOUNTER — Encounter: Payer: BC Managed Care – PPO | Attending: General Surgery | Admitting: Dietician

## 2022-08-15 VITALS — Ht 67.5 in | Wt 297.1 lb

## 2022-08-15 DIAGNOSIS — E669 Obesity, unspecified: Secondary | ICD-10-CM | POA: Insufficient documentation

## 2022-08-15 NOTE — Progress Notes (Signed)
2 Week Post-Operative Nutrition Class   Patient was seen on 08/15/2022 for Post-Operative Nutrition education at the [facility].    Surgery date: 07/31/2022 Surgery type: Sleeve Gastrectomy  Anthropometrics  Start weight at [facility]: 313.1 lbs (date: 05/19/2022)  Height: 67.5 in Weight today: 297.1 lbs   Clinical   Allergic to: Saxxenda, Trulicity  Medical hx: obesity, T2DM Medications: Wellbutrin, citalopram  Labs: no recent labs in EMR Notable signs/symptoms: none noted Any previous deficiencies? No Bowel Habits: Every day to every other day; some diarrhea and gas   Body Composition Scale 08/15/2022  Current Body Weight 297.1  Total Body Fat % 46.8  Visceral Fat 16  Fat-Free Mass % 53.1   Total Body Water % 41.0  Muscle-Mass lbs 35.5  BMI 45.7  Body Fat Displacement          Torso  lbs 86.4         Left Leg  lbs 17.2         Right Leg  lbs 17.2         Left Arm  lbs 8.6         Right Arm  lbs 8.6      The following the learning objectives were met by the patient during this course: Identifies Soft Prepped Plan Advancement Guide  Identifies Soft, High Proteins (Phase 1), beginning 2 weeks post-operatively to 3 weeks post-operatively Identifies Additional Soft High Proteins, soft non-starchy vegetables, fruits and starches (Phase 2), beginning 3 weeks post-operatively to 3 months post-operatively Identifies appropriate sources of fluids, proteins, vegetables, fruits and starches Identifies appropriate fat sources and healthy verses unhealthy fat types   States protein, vegetable, fruit and starch recommendations and appropriate sources post-operatively Identifies the need for appropriate texture modifications, mastication, and bite sizes when consuming solids Identifies appropriate fat consumption and sources Identifies appropriate multivitamin and calcium sources post-operatively Describes the need for physical activity post-operatively and  will follow MD recommendations States when to call healthcare provider regarding medication questions or post-operative complications   Handouts given during class include: Soft Prepped Plan Advancement Guide   Follow-Up Plan: Patient will follow-up at [facility] in 10 weeks for 3 month post-op nutrition visit for diet advancement per MD.

## 2022-08-23 ENCOUNTER — Telehealth: Payer: Self-pay | Admitting: Dietician

## 2022-08-23 NOTE — Telephone Encounter (Signed)
RD called pt to verify fluid intake once starting soft, solid proteins 2 week post-bariatric surgery.   Daily Fluid intake:  Daily Protein intake:  Bowel Habits:   Concerns/issues:    Left Voice Message 

## 2024-01-25 ENCOUNTER — Other Ambulatory Visit: Payer: Self-pay | Admitting: Medical Genetics

## 2024-02-27 ENCOUNTER — Encounter (HOSPITAL_COMMUNITY): Payer: Self-pay | Admitting: *Deleted
